# Patient Record
Sex: Female | Born: 1975 | Race: White | Hispanic: No | Marital: Married | Smoking: Never smoker
Health system: Southern US, Community
[De-identification: ages and names within clinical notes are randomized; demographics above are authoritative.]

## PROBLEM LIST (undated history)

## (undated) DIAGNOSIS — J189 Pneumonia, unspecified organism: Secondary | ICD-10-CM

## (undated) DIAGNOSIS — Z789 Other specified health status: Secondary | ICD-10-CM

---

## 2003-12-18 ENCOUNTER — Other Ambulatory Visit: Admission: RE | Admit: 2003-12-18 | Discharge: 2003-12-18 | Payer: Self-pay | Admitting: Obstetrics and Gynecology

## 2004-05-07 ENCOUNTER — Other Ambulatory Visit: Admission: RE | Admit: 2004-05-07 | Discharge: 2004-05-07 | Payer: Self-pay | Admitting: Obstetrics and Gynecology

## 2004-10-15 ENCOUNTER — Inpatient Hospital Stay (HOSPITAL_COMMUNITY): Admission: AD | Admit: 2004-10-15 | Discharge: 2004-10-18 | Payer: Self-pay | Admitting: Obstetrics and Gynecology

## 2004-10-19 ENCOUNTER — Encounter: Admission: RE | Admit: 2004-10-19 | Discharge: 2004-11-18 | Payer: Self-pay | Admitting: Pediatrics

## 2004-11-18 ENCOUNTER — Other Ambulatory Visit: Admission: RE | Admit: 2004-11-18 | Discharge: 2004-11-18 | Payer: Self-pay | Admitting: Obstetrics and Gynecology

## 2007-10-20 ENCOUNTER — Inpatient Hospital Stay (HOSPITAL_COMMUNITY): Admission: AD | Admit: 2007-10-20 | Discharge: 2007-10-23 | Payer: Self-pay | Admitting: Obstetrics and Gynecology

## 2007-10-20 ENCOUNTER — Encounter (INDEPENDENT_AMBULATORY_CARE_PROVIDER_SITE_OTHER): Payer: Self-pay | Admitting: Obstetrics & Gynecology

## 2010-07-07 NOTE — Op Note (Signed)
[name], [name]              ACCOUNT NO.:  [id number]   MEDICAL RECORD NO.:  [id number]          PATIENT TYPE:  INP   LOCATION:  9120                          FACILITY:  [facility]   PHYSICIAN:  Freddy Finner, M.D.   DATE OF BIRTH:  [birth year]   DATE OF PROCEDURE:  10/20/2007  DATE OF DISCHARGE:                               OPERATIVE REPORT   PREOPERATIVE DIAGNOSES:  1. Intrauterine pregnancy 36-1/[redacted] weeks gestation.  2. Previous cesarean delivery for failure to progress with a 5 pound      12 ounce infant.  3. Planned repeat cesarean section.   POSTOPERATIVE DIAGNOSES:  1. Intrauterine pregnancy 36-1/[redacted] weeks gestation.  2. Previous cesarean delivery for failure to progress with a 5 pound      12 ounce infant.  3. Planned repeat cesarean section.   OPERATIVE PROCEDURE:  Repeat low transverse cervical cesarean section  with midline extension and uterine incision with delivery of viable female  infant, Apgars of 8 and 9, arterial cord pH 7.28.  Delivery was vacuum-  assisted using a Kiwi device.   ANESTHESIA:  Spinal.   ESTIMATED BLOOD LOSS:  800 mL.   INTRAOPERATIVE COMPLICATIONS:  None.   DESCRIPTION OF PROCEDURE:  The patient is a 35 year old married, gravida  2, para 1, who has a history of a cesarean delivery after failure to  progress in labor with a small infant.  She was scheduled for repeat  cesarean but presented in labor on the morning of surgery.  Initial  attempts were made by Dr. Vincente Poli to suppress the labor with IM  terbutaline in hopes of allowing the husband to return from his work  with anticipated arrival at approximately 5:00 p.m.  After two  injections of terbutaline and IV fluid hydration.  The patient was  continuing to labor and cervix had progressed from 3 to 4 to 5 cm.  It  was elected at this point to proceed with cesarean delivery.   She was brought to the operating room and there placed under adequate IV  sedation. She was given a 1 gram bolus of  Ancef IV before the incision.  The abdomen was prepped and draped in the usual fashion with sterile  technique and Foley catheter was placed with sterile technique.  A lower  abdominal transverse incision was made through an old scar and carried  sharply down to the fascia.  Fascia was entered sharply and extended to  the external skin incision.  Rectus muscles were divided in midline.  Peritoneum was entered very high with marked advancement of the bladder  noted.  The bladder was carefully dissected off the lower segment.  Bladder blade was placed.  The uterine lower segment was incised sharply  with a scalpel in the transverse direction.  The amniotic fluid was  noted to be clear.  The incision was extended with bandage scissors.  There was marked scarring and variable elasticity of the abdominal wall  opening and in the uterus.  The infant was elevated out of the pelvis.  Device applied.  Initial attempt was unsuccessful to deliver and at  this  point the right rectus muscle was partially divided on the uterine  incision T'd with approximately 3 cm incision in the midline from the  lower incision.  This allowed delivery of the infant.  Single loose  nuchal cord or shoulder cord was reduced.  The infant was then delivered  without further difficulty and was a viable female.  Apgars were 8 and 9.  Cord pH 7.28.  This was an arterial sample.  The placenta was donated  for cord blood harvest.  The uterus was evacuated manually and careful  manual exploration confirmed complete evacuation of products of  conception.  Uterus was delivered onto the anterior abdominal wall.  Tubes and ovaries inspected and found to be normal.  The uterus itself  was normal.  Uterine incision was then closed with a running locking  Monocryl for the first layer, which included the vertical segment which  was approximated in the midline and at the midline of the lower margin  of the incision in the uterus.  An  imbricating suture of 0 Monocryl was  used for the second layer closure.  Two figure-of-eights were required  at the T portion of the incision for complete hemostasis.  The uterus  was delivered back into the abdominal cavity.  Irrigation was carried  out.  Hemostasis was adequate.  Pack, needle and instrument counts were  correct.  Abdominal incision was then closed in layers.  The peritoneum  was closed with a running 0 Monocryl suture which was also used to  approximate the lower portion of the rectus muscles.  An interrupted  figure-of-eight suture of 0 Monocryl was used to reapproximate the  divided right rectus muscle.  This included fascia in the upper margin  to hold the muscle in place.  The fascia was then closed with a looped 0  PDS running from angle to angle on either side.  Subcutaneous tissue was  approximated with 2-0 plain.  Bovie was used to control a couple  bleeding vessels and the subcutaneous space.  The hemostasis was felt to  be adequate.  The skin was closed with skin staples and quarter-inch  Steri-Strips.  Sterile dressing was applied.  The patient was taken to  the recovery room in good condition.      Freddy Finner, M.D.  Electronically Signed     WRN/MEDQ  D:  10/20/2007  T:  10/21/2007  Job:  [id number]

## 2010-07-10 NOTE — Discharge Summary (Signed)
[name], [name]              ACCOUNT NO.:  [id number]   MEDICAL RECORD NO.:  [id number]           PATIENT TYPE:   LOCATION:                                 FACILITY:   PHYSICIAN:  Duke Salvia. Marcelle Overlie, M.D.DATE OF BIRTH:  April 13, 1975   DATE OF ADMISSION:  10/19/2007  DATE OF DISCHARGE:  10/23/2007                               DISCHARGE SUMMARY   ADMITTING DIAGNOSIS:  1. Intrauterine pregnancy at 36-1/2 weeks estimated gestational age.  2. Previous cesarean section with plans of repeat scheduled.  3. Spontaneous onset of labor.   DISCHARGE DIAGNOSIS:  Status post low transverse cesarean section to a  viable female infant.   PROCEDURE:  Repeat low transverse cesarean section.   REASON FOR ADMISSION:  Please see written H&P.   HOSPITAL COURSE:  The patient is 35 year old gravida 2, para 1 who  presented to [facility] with early onset of labor.  The  patient had had a previous cesarean section and had a repeat scheduled.  On admission, the patient was given terbutaline in hopes that it would  allow time for husband to arrive at the hospital and to delay the onset  of her labor.  After 2 injections of the terbutaline and IV hydration,  the patient continued to labor and cervix had now progressed to  approximately 5 cm in dilatation.  Decision was made to proceed then  with the cesarean delivery.  She was then taken to the operating room  where spinal anesthesia was administered without difficulty.  A low  transverse incision was made with delivery of a viable female infant  weighing 6 pounds 14 ounces.  Apgars of 8 at 1 minute, and 9 at 5  minute.  Arterial cord pH of 7.28.  The patient tolerated the procedure  well and was taken to the recovery room in stable condition.  On  postoperative day #1, the patient was without complaint.  Vital signs  were stable.  She is afebrile.  Fundus firm and nontender.  Incision was  clean, dry, and intact.  Laboratory findings  showed hemoglobin of 10.6.  On postoperative day #2, the patient was without complaint.  Vital signs  were stable.  She is afebrile.  She is ambulating well, tolerating a  regular diet without complaints of nausea and vomiting.  On  postoperative day #3, the patient was without complaint.  Vital signs  remained stable.  She is afebrile.  Fundus firm and nontender.  Incision  was clean, dry, and intact.  Her staples removed and the patient was  later discharged home.   CONDITION ON DISCHARGE:  Stable.   DIET:  Regular as tolerated.   ACTIVITY:  No heavy lifting.  No driving x2 weeks.  No vaginal entry.   FOLLOW UP:  The patient to follow up in the office in 1-2 weeks for an  incision check.  She is to call for temperature greater than 100  degrees, persistent nausea, vomiting, heavy vaginal bleeding and/or  redness or drainage from the incisional site.   DISCHARGE MEDICATIONS:  1. Tylox #31 p.o. q. 6h.  2. Motrin 600 mg every 6 hours.  3. Prenatal vitamins 1 p.o. daily.  4. Colace 1 p.o. daily.      Julio Sicks, N.P.      Richard M. Marcelle Overlie, M.D.  Electronically Signed    CC/MEDQ  D:  11/22/2007  T:  11/22/2007  Job:  [id number]

## 2010-07-10 NOTE — Discharge Summary (Signed)
Anita, [name]              ACCOUNT NO.:  [id number]   MEDICAL RECORD NO.:  [id number]          PATIENT TYPE:  INP   LOCATION:  9103                          FACILITY:  [facility]   PHYSICIAN:  [name], M.D.   DATE OF BIRTH:  05-05-1975   DATE OF ADMISSION:  10/15/2004  DATE OF DISCHARGE:  10/18/2004                                 DISCHARGE SUMMARY   ADMITTING DIAGNOSES:  1.  Intrauterine pregnancy at 58 weeks estimated gestational age.  2.  Spontaneous onset of labor with spontaneous rupture of membranes.   DISCHARGE DIAGNOSES:  1.  Status post low transverse cesarean section secondary to failure to      progress with cephalopelvic disproportion.  2.  Viable female infant.   PROCEDURE:  Primary low transverse cesarean section.   REASON FOR ADMISSION:  Please see written H&P.   HOSPITAL COURSE:  The patient was a 35 year old primigravida that was  admitted to [facility] at 36 weeks estimated gestational  age with spontaneous onset of labor and spontaneous rupture of membranes.  Fluid was noted to be clear. Fetal heart tones were 142 beats per minute  with good reactivity. Cervix was dilated to 3 cm, 50% effaced vertex  presentation. Vital signs were otherwise stable. The patient was known to  have positive group B beta strep and IV antibiotics were administered  prophylactically. The patient did progress to full dilatation and began  pushing. The patient pushed for approximately 2 hours without further  descent other than a 0 to a +1 station. Fetal vertex was noted to be  molding. Fetal heart tones continued to be reactive. Decision was made to  proceed with a primary low transverse cesarean section. The patient was then  transferred to the operating room where spinal anesthesia was administered  without difficulty. A low transverse incision was made with the delivery of  a viable female infant weighing 5 pounds 5 ounces with Apgars of 9 at one  minute  and 9 at five minutes. Arterial cord pH was 7.35. The patient  tolerated the procedure well and was taken to the recovery room in stable  condition. On postoperative day #1 the patient was without complaint. Vital  signs were stable. She was afebrile at the time of rounding. Temperature max  had been noted to be 100.4 a 1 a.m. Abdomen was soft with good return of  bowel function. Fundus was firm and nontender. Abdominal dressing was noted  to be clean, dry and intact. Foley continued to drain and output had noted  to be 250 mL over the last 8 hours. Laboratory findings revealed hemoglobin  of 10.5; platelet count 157,000; wbc count of 18.5. The IV fluids were  discontinued; however, oral fluids were encouraged. Diet was advanced to a  regular diet and abdominal dressing was removed. On postoperative day #2 the  patient was without complaint. Vital signs were stable. She was now  afebrile. Fundus was firm and nontender. Incision was clean, dry and intact.  The patient was voiding well with sufficient amount of output. On  postoperative day #3 the  patient was without complaint. Vital signs remained  stable. Fundus firm and nontender. Incision was clean, dry and intact.  Staples were removed and the patient was discharged home.   CONDITION ON DISCHARGE:  Good.   DIET:  Regular as tolerated.   ACTIVITY:  No heavy lifting, no driving x2 weeks, no vaginal entry.   FOLLOW-UP:  The patient is to follow up in the office in 1 week for an  incision check. She is to call for temperature greater than 100 degrees,  persistent nausea and vomiting, heavy vaginal bleeding, and/or redness or  drainage from incisional site.   DISCHARGE MEDICATIONS:  1.  Tylox #30 one p.o. q.4-6h. p.r.n.  2.  Motrin 600 mg q.6h.  3.  Prenatal vitamins one p.o. daily.  4.  Colace one p.o. daily p.r.n.      Julio Sicks, N.P.      [name], M.D.  Electronically Signed    CC/MEDQ  D:  11/20/2004  T:   11/20/2004  Job:  [id number]

## 2010-07-10 NOTE — Op Note (Signed)
[name], [name]              ACCOUNT NO.:  [id number]   MEDICAL RECORD NO.:  [id number]          PATIENT TYPE:  INP   LOCATION:  9103                          FACILITY:  [facility]   PHYSICIAN:  Duke Salvia. Marcelle Overlie, M.D.DATE OF BIRTH:  Apr 22, 1975   DATE OF PROCEDURE:  10/15/2004  DATE OF DISCHARGE:                                 OPERATIVE REPORT   PREOPERATIVE DIAGNOSIS:  Cephalopelvic disproportion.   POSTOPERATIVE DIAGNOSIS:  Cephalopelvic disproportion.   OPERATION/PROCEDURE:  Primary low transverse cesarean section.   SURGEON:  Duke Salvia. Marcelle Overlie, M.D.   ANESTHESIA:  Epidural.   COMPLICATIONS:  None.   DRAINS:  Foley catheter.   ESTIMATED BLOOD LOSS:  1000 mL.   DESCRIPTION OF PROCEDURE:  The patient was taken to the operating room.  After an adequate level of epidural anesthetic was obtained with the  patient's legs supine, in the left tilt position, the abdomen was prepped  and draped in the usual manner for sterile abdominal procedure.  Foley  catheter was positioned draining clear urine.   A transverse incision was made two fingerbreadths below the symphysis and  carried down to the fascia which was incised and extended transversely.  Rectus muscle divided in the midline.  Peritoneum entered superiorly without  incident and extended in a vertical manner.  The bladder blade was inserted.  The vesicouterine serosa was then incised and the bladder was bluntly and  sharply dissected off the lower uterine segment and the bladder blade was  repositioned.  The uterine incision was then made and extended transversely  with blunt dissection.  Clear fluid was then noted.  The vertex was tight in  the pelvis but was easily dislodged, slightly rotated and delivered without  difficulty.  The  infant was suctioned, cord was clamped and the infant  passed to the pediatric team for further care.  A female weighed 5 pounds 5  ounces.  Apgars 9 and 9.  Cord PH was sent and is pending  at the time of  this dictation.  Placenta was delivered manually intact.  The uterus was  exteriorized.  Cavity wiped clean with laparotomy pack.  On the left angle  of the incision there was some extension downward into the edge of the  myometrium.  With careful exposure this was sutured at the angle with a  running locked 0 chromic suture followed by an imbricating layer of chromic.  This was hemostatic.  Repeat exposure revealed the bladder flap area to be  intact and hemostatic;  the uterine incision to be hemostatic also.  Prior  to closure, sponge, needle and instrument counts were reported as correct  x2.  The uterine incision was imbricated with an 0 chromic suture.  After  this again, hemostasis was noted to be excellent.  Tubes and ovaries were  normal.  Prior to closure, sponge, needle and instrument counts were  reported as correct x2.  Peritoneum closed with running 2-0 Vicryl suture.  Fascia was closed from laterally to midline at this time with a 0 PDS  suture.  clips and Steri-Strips used on the skin.  She tolerated this well  and went to the recovery room in good condition.   She had been on penicillin for group B strep prophylaxis and also received  Ancef 1 g IV after the cord was clamped.  Clear urine noted at the end of  the case.      Richard M. Marcelle Overlie, M.D.  Electronically Signed     RMH/MEDQ  D:  10/15/2004  T:  10/16/2004  Job:  [id number]

## 2010-11-01 ENCOUNTER — Inpatient Hospital Stay (HOSPITAL_COMMUNITY): Payer: BC Managed Care – PPO | Admitting: Anesthesiology

## 2010-11-01 ENCOUNTER — Encounter (HOSPITAL_COMMUNITY): Payer: Self-pay | Admitting: Anesthesiology

## 2010-11-01 ENCOUNTER — Other Ambulatory Visit: Payer: Self-pay | Admitting: Obstetrics and Gynecology

## 2010-11-01 ENCOUNTER — Inpatient Hospital Stay (HOSPITAL_COMMUNITY)
Admission: AD | Admit: 2010-11-01 | Discharge: 2010-11-04 | DRG: 371 | Disposition: A | Discharge status: Home / Self Care | Payer: BC Managed Care – PPO | Source: Ambulatory Visit | Attending: Obstetrics and Gynecology | Admitting: Obstetrics and Gynecology

## 2010-11-01 ENCOUNTER — Encounter (HOSPITAL_COMMUNITY)
Admission: AD | Disposition: A | Discharge status: Home / Self Care | Payer: Self-pay | Source: Ambulatory Visit | Attending: Obstetrics and Gynecology

## 2010-11-01 ENCOUNTER — Encounter (HOSPITAL_COMMUNITY): Payer: Self-pay | Admitting: *Deleted

## 2010-11-01 DIAGNOSIS — Z98891 History of uterine scar from previous surgery: Secondary | ICD-10-CM

## 2010-11-01 DIAGNOSIS — O34219 Maternal care for unspecified type scar from previous cesarean delivery: Principal | ICD-10-CM | POA: Diagnosis present

## 2010-11-01 HISTORY — DX: Other specified health status: Z78.9

## 2010-11-01 LAB — URINALYSIS, ROUTINE W REFLEX MICROSCOPIC
Bilirubin Urine: NEGATIVE
Leukocytes, UA: NEGATIVE
Nitrite: NEGATIVE
Specific Gravity, Urine: 1.025 (ref 1.005–1.030)
Urobilinogen, UA: 0.2 mg/dL (ref 0.0–1.0)
pH: 6.5 (ref 5.0–8.0)

## 2010-11-01 LAB — CBC
MCH: 34.4 pg — ABNORMAL HIGH (ref 26.0–34.0)
MCHC: 34.4 g/dL (ref 30.0–36.0)
Platelets: 105 10*3/uL — ABNORMAL LOW (ref 150–400)
RDW: 13.7 % (ref 11.5–15.5)

## 2010-11-01 SURGERY — Surgical Case
Anesthesia: Regional

## 2010-11-01 MED ORDER — FENTANYL CITRATE 0.05 MG/ML IJ SOLN: 25.0000 ug | INTRAMUSCULAR | Status: DC | PRN

## 2010-11-01 MED ORDER — MEPERIDINE HCL 25 MG/ML IJ SOLN: 6.2500 mg | INTRAMUSCULAR | Status: DC | PRN

## 2010-11-01 MED ORDER — MORPHINE SULFATE 0.5 MG/ML IJ SOLN
INTRAMUSCULAR | Status: AC
Start: 2010-11-01 — End: 2010-11-01
  Filled 2010-11-01: qty 10

## 2010-11-01 MED ORDER — PRENATAL PLUS 27-1 MG PO TABS
1.0000 | ORAL_TABLET | Freq: Every day | ORAL | Status: DC
  Administered 2010-11-02 – 2010-11-04 (×3): 1 via ORAL
  Filled 2010-11-01 (×3): qty 1

## 2010-11-01 MED ORDER — PROMETHAZINE HCL 25 MG/ML IJ SOLN: 6.2500 mg | INTRAMUSCULAR | Status: DC | PRN

## 2010-11-01 MED ORDER — DEXTROSE 5 % IV SOLN: 2.0000 g | INTRAVENOUS | Status: DC

## 2010-11-01 MED ORDER — PRENATAL PLUS 27-1 MG PO TABS: 1.0000 | ORAL_TABLET | Freq: Every day | ORAL | Status: DC

## 2010-11-01 MED ORDER — OXYCODONE-ACETAMINOPHEN 5-325 MG PO TABS
1.0000 | ORAL_TABLET | ORAL | Status: DC | PRN
  Administered 2010-11-02 (×2): 1 via ORAL
  Filled 2010-11-01: qty 1
  Filled 2010-11-01 (×2): qty 2
  Filled 2010-11-01: qty 1
  Filled 2010-11-01 (×6): qty 2

## 2010-11-01 MED ORDER — MEDROXYPROGESTERONE ACETATE 150 MG/ML IM SUSP: 150.0000 mg | INTRAMUSCULAR | Status: DC | PRN

## 2010-11-01 MED ORDER — ONDANSETRON HCL 4 MG PO TABS: 4.0000 mg | ORAL_TABLET | ORAL | Status: DC | PRN

## 2010-11-01 MED ORDER — FENTANYL CITRATE 0.05 MG/ML IJ SOLN
INTRAMUSCULAR | Status: DC | PRN
  Administered 2010-11-01: 25 ug via INTRAVENOUS

## 2010-11-01 MED ORDER — DIPHENHYDRAMINE HCL 50 MG/ML IJ SOLN: 12.5000 mg | INTRAMUSCULAR | Status: DC | PRN

## 2010-11-01 MED ORDER — KETOROLAC TROMETHAMINE 30 MG/ML IJ SOLN: 30.0000 mg | Freq: Four times a day (QID) | INTRAMUSCULAR | Status: AC | PRN

## 2010-11-01 MED ORDER — ONDANSETRON HCL 4 MG/2ML IJ SOLN
INTRAMUSCULAR | Status: DC | PRN
  Administered 2010-11-01: 4 mg via INTRAVENOUS

## 2010-11-01 MED ORDER — IBUPROFEN 600 MG PO TABS
600.0000 mg | ORAL_TABLET | Freq: Four times a day (QID) | ORAL | Status: DC | PRN
  Administered 2010-11-01: 600 mg via ORAL
  Filled 2010-11-01: qty 1

## 2010-11-01 MED ORDER — OXYTOCIN 10 UNIT/ML IJ SOLN
INTRAMUSCULAR | Status: DC | PRN
  Administered 2010-11-01 (×2): 20 [IU] via INTRAMUSCULAR

## 2010-11-01 MED ORDER — DEXTROSE IN LACTATED RINGERS 5 % IV SOLN: INTRAVENOUS | Status: DC

## 2010-11-01 MED ORDER — OXYTOCIN 20 UNITS IN LACTATED RINGERS INFUSION - SIMPLE: 125.0000 mL/h | INTRAVENOUS | Status: AC

## 2010-11-01 MED ORDER — CITRIC ACID-SODIUM CITRATE 334-500 MG/5ML PO SOLN
ORAL | Status: AC
  Administered 2010-11-01: 30 mL
  Filled 2010-11-01: qty 15

## 2010-11-01 MED ORDER — SIMETHICONE 80 MG PO CHEW: 80.0000 mg | CHEWABLE_TABLET | ORAL | Status: DC | PRN

## 2010-11-01 MED ORDER — EPHEDRINE 5 MG/ML INJ
INTRAVENOUS | Status: AC
  Filled 2010-11-01: qty 10

## 2010-11-01 MED ORDER — ONDANSETRON HCL 4 MG/2ML IJ SOLN: 4.0000 mg | INTRAMUSCULAR | Status: DC | PRN

## 2010-11-01 MED ORDER — MEASLES, MUMPS & RUBELLA VAC ~~LOC~~ INJ
0.5000 mL | INJECTION | Freq: Once | SUBCUTANEOUS | Status: DC
  Filled 2010-11-01: qty 0.5

## 2010-11-01 MED ORDER — DIPHENHYDRAMINE HCL 25 MG PO CAPS: 25.0000 mg | ORAL_CAPSULE | Freq: Four times a day (QID) | ORAL | Status: DC | PRN

## 2010-11-01 MED ORDER — NALBUPHINE SYRINGE 5 MG/0.5 ML
5.0000 mg | INJECTION | INTRAMUSCULAR | Status: DC | PRN
  Filled 2010-11-01: qty 1

## 2010-11-01 MED ORDER — SENNOSIDES-DOCUSATE SODIUM 8.6-50 MG PO TABS
2.0000 | ORAL_TABLET | Freq: Every day | ORAL | Status: DC
  Administered 2010-11-01 – 2010-11-03 (×2): 2 via ORAL

## 2010-11-01 MED ORDER — NALBUPHINE HCL 10 MG/ML IJ SOLN
5.0000 mg | INTRAMUSCULAR | Status: DC | PRN
  Filled 2010-11-01: qty 1

## 2010-11-01 MED ORDER — METOCLOPRAMIDE HCL 5 MG/ML IJ SOLN: 10.0000 mg | Freq: Three times a day (TID) | INTRAMUSCULAR | Status: DC | PRN

## 2010-11-01 MED ORDER — DEXTROSE 5 % IV SOLN: 1.0000 g | INTRAVENOUS | Status: DC

## 2010-11-01 MED ORDER — NALOXONE HCL 0.4 MG/ML IJ SOLN: 0.4000 mg | INTRAMUSCULAR | Status: DC | PRN

## 2010-11-01 MED ORDER — EPHEDRINE SULFATE 50 MG/ML IJ SOLN
INTRAMUSCULAR | Status: DC | PRN
  Administered 2010-11-01 (×3): 10 mg via INTRAVENOUS

## 2010-11-01 MED ORDER — SODIUM CHLORIDE 0.9 % IJ SOLN: 3.0000 mL | INTRAMUSCULAR | Status: DC | PRN

## 2010-11-01 MED ORDER — DIPHENHYDRAMINE HCL 25 MG PO CAPS: 25.0000 mg | ORAL_CAPSULE | ORAL | Status: DC | PRN

## 2010-11-01 MED ORDER — LANOLIN HYDROUS EX OINT: 1.0000 "application " | TOPICAL_OINTMENT | CUTANEOUS | Status: DC | PRN

## 2010-11-01 MED ORDER — DIBUCAINE 1 % RE OINT: 1.0000 "application " | TOPICAL_OINTMENT | RECTAL | Status: DC | PRN

## 2010-11-01 MED ORDER — SIMETHICONE 80 MG PO CHEW
80.0000 mg | CHEWABLE_TABLET | Freq: Three times a day (TID) | ORAL | Status: DC
  Administered 2010-11-01 – 2010-11-04 (×9): 80 mg via ORAL

## 2010-11-01 MED ORDER — SODIUM CHLORIDE 0.9 % IV SOLN
1.0000 ug/kg/h | INTRAVENOUS | Status: DC | PRN
  Filled 2010-11-01: qty 2.5

## 2010-11-01 MED ORDER — WITCH HAZEL-GLYCERIN EX PADS: 1.0000 "application " | MEDICATED_PAD | CUTANEOUS | Status: DC | PRN

## 2010-11-01 MED ORDER — SCOPOLAMINE 1 MG/3DAYS TD PT72
MEDICATED_PATCH | TRANSDERMAL | Status: AC
  Administered 2010-11-01: 1.5 mg
  Filled 2010-11-01: qty 1

## 2010-11-01 MED ORDER — ACETAMINOPHEN 10 MG/ML IV SOLN
1000.0000 mg | Freq: Four times a day (QID) | INTRAVENOUS | Status: AC | PRN
  Filled 2010-11-01: qty 100

## 2010-11-01 MED ORDER — FAMOTIDINE IN NACL 20-0.9 MG/50ML-% IV SOLN
INTRAVENOUS | Status: AC
  Administered 2010-11-01: 10:00:00
  Filled 2010-11-01: qty 50

## 2010-11-01 MED ORDER — OXYTOCIN 10 UNIT/ML IJ SOLN
INTRAMUSCULAR | Status: AC
  Filled 2010-11-01: qty 2

## 2010-11-01 MED ORDER — ONDANSETRON HCL 4 MG/2ML IJ SOLN
INTRAMUSCULAR | Status: AC
  Filled 2010-11-01: qty 2

## 2010-11-01 MED ORDER — ONDANSETRON HCL 4 MG/2ML IJ SOLN: 4.0000 mg | Freq: Three times a day (TID) | INTRAMUSCULAR | Status: DC | PRN

## 2010-11-01 MED ORDER — SCOPOLAMINE 1 MG/3DAYS TD PT72
1.0000 | MEDICATED_PATCH | Freq: Once | TRANSDERMAL | Status: DC
  Filled 2010-11-01: qty 1

## 2010-11-01 MED ORDER — TERBUTALINE SULFATE 1 MG/ML IJ SOLN
0.2500 mg | Freq: Once | INTRAMUSCULAR | Status: AC
  Administered 2010-11-01: 0.25 mg via SUBCUTANEOUS
  Filled 2010-11-01: qty 1

## 2010-11-01 MED ORDER — CEFAZOLIN SODIUM 1-5 GM-% IV SOLN
INTRAVENOUS | Status: DC | PRN
  Administered 2010-11-01: 2 g via INTRAVENOUS

## 2010-11-01 MED ORDER — FENTANYL CITRATE 0.05 MG/ML IJ SOLN
INTRAMUSCULAR | Status: AC
  Filled 2010-11-01: qty 2

## 2010-11-01 MED ORDER — TETANUS-DIPHTH-ACELL PERTUSSIS 5-2.5-18.5 LF-MCG/0.5 IM SUSP: 0.5000 mL | Freq: Once | INTRAMUSCULAR | Status: DC

## 2010-11-01 MED ORDER — DIPHENHYDRAMINE HCL 50 MG/ML IJ SOLN: 25.0000 mg | INTRAMUSCULAR | Status: DC | PRN

## 2010-11-01 MED ORDER — BUPIVACAINE IN DEXTROSE 0.75-8.25 % IT SOLN
INTRATHECAL | Status: DC | PRN
  Administered 2010-11-01: 1.5 mL via INTRATHECAL

## 2010-11-01 MED ORDER — MORPHINE SULFATE (PF) 0.5 MG/ML IJ SOLN
INTRAMUSCULAR | Status: DC | PRN
  Administered 2010-11-01: .1 mg via EPIDURAL

## 2010-11-01 MED ORDER — MENTHOL 3 MG MT LOZG: 1.0000 | LOZENGE | OROMUCOSAL | Status: DC | PRN

## 2010-11-01 MED ORDER — ACETAMINOPHEN 325 MG PO TABS
325.0000 mg | ORAL_TABLET | ORAL | Status: DC | PRN
  Administered 2010-11-02 – 2010-11-04 (×6): 650 mg via ORAL
  Filled 2010-11-01 (×6): qty 2

## 2010-11-01 MED ORDER — LACTATED RINGERS IV SOLN
INTRAVENOUS | Status: DC
  Administered 2010-11-01 (×3): via INTRAVENOUS

## 2010-11-01 SURGICAL SUPPLY — 26 items
CHLORAPREP W/TINT 26ML (MISCELLANEOUS) ×2 IMPLANT
CLOTH BEACON ORANGE TIMEOUT ST (SAFETY) ×2 IMPLANT
ELECT REM PT RETURN 9FT ADLT (ELECTROSURGICAL) ×2
ELECTRODE REM PT RTRN 9FT ADLT (ELECTROSURGICAL) ×1 IMPLANT
EXTRACTOR VACUUM M CUP 4 TUBE (SUCTIONS) ×1 IMPLANT
GLOVE BIO SURGEON STRL SZ 6.5 (GLOVE) ×2 IMPLANT
GLOVE BIOGEL PI IND STRL 7.0 (GLOVE) ×2 IMPLANT
GLOVE BIOGEL PI INDICATOR 7.0 (GLOVE) ×2
GOWN PREVENTION PLUS LG XLONG (DISPOSABLE) ×6 IMPLANT
KIT ABG SYR 3ML LUER SLIP (SYRINGE) ×2 IMPLANT
NDL HYPO 25X5/8 SAFETYGLIDE (NEEDLE) ×1 IMPLANT
NEEDLE HYPO 25X5/8 SAFETYGLIDE (NEEDLE) ×2 IMPLANT
NS IRRIG 1000ML POUR BTL (IV SOLUTION) ×2 IMPLANT
PACK C SECTION WH (CUSTOM PROCEDURE TRAY) ×2 IMPLANT
PAD ABD DERMACEA PRESS 5X9 (GAUZE/BANDAGES/DRESSINGS) ×1 IMPLANT
SLEEVE SCD COMPRESS KNEE MED (MISCELLANEOUS) ×1 IMPLANT
STAPLER VISISTAT 35W (STAPLE) ×1 IMPLANT
SUT CHROMIC 0 CT 802H (SUTURE) IMPLANT
SUT CHROMIC 0 CTX 36 (SUTURE) ×6 IMPLANT
SUT MNCRL AB 3-0 PS2 27 (SUTURE) IMPLANT
SUT MON AB-0 CT1 36 (SUTURE) ×3 IMPLANT
SUT PDS AB 0 CTX 60 (SUTURE) ×2 IMPLANT
SUT PLAIN 0 NONE (SUTURE) IMPLANT
TOWEL OR 17X24 6PK STRL BLUE (TOWEL DISPOSABLE) ×4 IMPLANT
TRAY FOLEY CATH 14FR (SET/KITS/TRAYS/PACK) ×1 IMPLANT
WATER STERILE IRR 1000ML POUR (IV SOLUTION) ×2 IMPLANT

## 2010-11-01 NOTE — ED Provider Notes (Signed)
[name] is 35 y.o. [id number]. [redacted]w[redacted]d She presents complaining of Contractions  .  Onset is described as gradual and has been present since 2100 last night. States ctxs getting stronger and closer together. Reports + FM. Denies blding or LOF  Pt state she is scheduled for a repeat c-section on 11/25/10  Obstetrical/Gynecological History:  OB History    Grav  Para  Term  Preterm  Abortions  TAB  SAB  Ect  Mult  Living    3  2  0  2  0  0  0  0  0  2      Past Medical History:  Past Medical History   Diagnosis  Date   .  No pertinent past medical history     Past Surgical History:  Past Surgical History   Procedure  Date   .  Cesarean section     Family History:  No family history on file.  Social History:  History   Substance Use Topics   .  Smoking status:  Never Smoker   .  Smokeless tobacco:  Not on file   .  Alcohol Use:  No    Allergies: No Known Allergies  Prescriptions prior to admission   Medication  Sig  Dispense  Refill   .  calcium carbonate (TUMS - DOSED IN MG ELEMENTAL CALCIUM) 500 MG chewable tablet  Chew 1 tablet by mouth daily as needed. For heartburn     .  prenatal vitamin w/FE, FA (PRENATAL 1 + 1) 27-1 MG TABS  Take 1 tablet by mouth daily.      Review of Systems - Negative except what has been reviewed in the HPI  Physical Exam   Blood pressure 125/78, pulse 77, temperature 98.9 F (37.2 C), temperature source Oral, resp. rate 20, height 5\' 2"  (1.575 m), weight 99.882 kg (220 lb 3.2 oz).  General: General appearance - alert, well appearing, and in no distress and overweight  Mental status - alert, oriented to person, place, and time, normal mood, behavior, speech, dress, motor activity, and thought processes  Abdomen - gravid, ctxing, mild/mod to palp  Focused Gynecological Exam: CERVIX: FT/70/vtx/ballotable  FHR: 130, mod variability, + 15x15 accels, no decels  Toco: 3-5   MAU course: MD Consult: Discussed with Dr. Renaldo Fiddler, orders received for .25mg   Terbutaline SQ. Plan to obs and recheck cervix in 2 hours   Care assumed from [place], CNM.   Recheck of cervix after >2 hours observation - 1.5/75/-1 Pt reports contractions are returning and becoming more uncomfortable, rates at 5/10.   Consult with Dr. Renaldo Fiddler, plan to proceed with repeat c/s.

## 2010-11-01 NOTE — Anesthesia Procedure Notes (Signed)
Spinal Block  Patient location during procedure: OR Start time: 11/01/2010 11:00 AM Staffing Performed by: anesthesiologist  Preanesthetic Checklist Completed: patient identified, site marked, surgical consent, pre-op evaluation, timeout performed, IV checked, risks and benefits discussed and monitors and equipment checked Spinal Block Patient position: sitting Prep: DuraPrep Patient monitoring: heart rate, cardiac monitor, continuous pulse ox and blood pressure Approach: midline Location: L3-4 Injection technique: single-shot Needle Needle type: Sprotte  Needle gauge: 24 G Needle length: 9 cm Assessment Sensory level: T4 Additional Notes Patient identified.  Risk benefits discussed including failed block, incomplete pain control, headache, nerve damage, paralysis, blood pressure changes, nausea, vomiting, reactions to medication both toxic or allergic, and postpartum back pain.  Patient expressed understanding and wished to proceed.  All questions were answered.  Sterile technique used throughout procedure.  CSF was clear.  No parasthesia or other complications.  Please see nursing notes for vital signs.

## 2010-11-01 NOTE — Anesthesia Postprocedure Evaluation (Signed)
Anesthesia Post Note  Patient: [name]  Procedure(s) Performed:  CESAREAN SECTION  Anesthesia type: Spinal  Patient location: PACU  Post pain: Pain level controlled  Post assessment: Post-op Vital signs reviewed  Last Vitals:  Filed Vitals:   11/01/10 1315  BP: 109/65  Pulse: 86  Temp: 98.4 F (36.9 C)  Resp: 16    Post vital signs: Reviewed  Level of consciousness: awake  Complications: No apparent anesthesia complications

## 2010-11-01 NOTE — Transfer of Care (Signed)
Immediate Anesthesia Transfer of Care Note  Patient: [name]  Procedure(s) Performed:  CESAREAN SECTION  Patient Location: PACU  Anesthesia Type: Spinal  Level of Consciousness: awake, alert  and oriented  Airway & Oxygen Therapy: Patient Spontanous Breathing  Post-op Assessment: Report given to PACU RN vital signs stable  Post vital signs: Reviewed and stable  Complications: No apparent anesthesia complications

## 2010-11-01 NOTE — Anesthesia Preprocedure Evaluation (Signed)
Anesthesia Evaluation  Name, MR# and DOB Patient awake  General Assessment Comment  Reviewed: Allergy & Precautions, H&P , Patient's Chart, lab work & pertinent test results  Airway Mallampati: III TM Distance: >3 FB Neck ROM: full    Dental No notable dental hx.    Pulmonary  clear to auscultation  pulmonary exam normalPulmonary Exam Normal breath sounds clear to auscultation none    Cardiovascular regular Normal    Neuro/Psych Negative Neurological ROS  Negative Psych ROS  GI/Hepatic/Renal negative GI ROS  negative Liver ROS  negative Renal ROS        Endo/Other  Negative Endocrine ROS (+)      Abdominal   Musculoskeletal   Hematology negative hematology ROS (+)   Peds  Reproductive/Obstetrics (+) Pregnancy    Anesthesia Other Findings             Anesthesia Physical Anesthesia Plan  ASA: III and Emergent  Anesthesia Plan: Spinal   Post-op Pain Management:    Induction:   Airway Management Planned:   Additional Equipment:   Intra-op Plan:   Post-operative Plan:   Informed Consent: I have reviewed the patients History and Physical, chart, labs and discussed the procedure including the risks, benefits and alternatives for the proposed anesthesia with the patient or authorized representative who has indicated his/her understanding and acceptance.     Plan Discussed with:   Anesthesia Plan Comments:         Anesthesia Quick Evaluation

## 2010-11-01 NOTE — Consult Note (Addendum)
Neonatology Note:  Attendance at C-section:  I was asked to attend this repeat C/S at 35 4/7 weeks due to onset of labor. The mother is a G3P2 GBS unknown with history of preterm delivery (at 36 weeks times 2). ROM at delivery, fluid clear. Vacuum-assisted delivery, vertex. Infant vigorous with good spontaneous cry and tone. Needed only minimal bulb suctioning. Ap 9/9. Lungs clear to ausc in DR. Bruising behind left knee noted in DR. Further bruising was noted on the left arm and torso after admission to the CN. No petechiae, only benign pustular melanosis with cuffing. Transferred to care of Pediatrician.  Karol Liendo, MD  

## 2010-11-01 NOTE — Progress Notes (Signed)
G3P2 at 35.5wks. Contractions since 2100 which have gotten closer and stronger. Denies bleeding or d/c. For scheduled repeat C/S 10/3

## 2010-11-01 NOTE — ED Provider Notes (Signed)
Chief Complaint:  Contractions   [name] is  35 y.o. [id number]. [redacted]w[redacted]d  She presents complaining of Contractions . Onset is described as gradual and has been present since 2100 last night. States ctxs getting stronger and closer together. Reports + FM. Denies blding or LOF Pt state she is scheduled for a repeat c-section on 11/25/10  Obstetrical/Gynecological History: OB History    Grav Para Term Preterm Abortions TAB SAB Ect Mult Living   3 2 0 2 0 0 0 0 0 2       Past Medical History: Past Medical History  Diagnosis Date  . No pertinent past medical history     Past Surgical History: Past Surgical History  Procedure Date  . Cesarean section     Family History: No family history on file.  Social History: History  Substance Use Topics  . Smoking status: Never Smoker   . Smokeless tobacco: Not on file  . Alcohol Use: No    Allergies: No Known Allergies  Prescriptions prior to admission  Medication Sig Dispense Refill  . calcium carbonate (TUMS - DOSED IN MG ELEMENTAL CALCIUM) 500 MG chewable tablet Chew 1 tablet by mouth daily as needed. For heartburn       . prenatal vitamin w/FE, FA (PRENATAL 1 + 1) 27-1 MG TABS Take 1 tablet by mouth daily.          Review of Systems - Negative except what has been reviewed in the HPI  Physical Exam   Blood pressure 125/78, pulse 77, temperature 98.9 F (37.2 C), temperature source Oral, resp. rate 20, height 5\' 2"  (1.575 m), weight 99.882 kg (220 lb 3.2 oz).  General: General appearance - alert, well appearing, and in no distress and overweight Mental status - alert, oriented to person, place, and time, normal mood, behavior, speech, dress, motor activity, and thought processes Abdomen - gravid, ctxing, mild/mod to palp Focused Gynecological Exam: CERVIX: FT/70/vtx/ballotable FHR: 130, mod variability, + 15x15 accels, no decels Toco: 3-5  MD Consult: Discussed with Dr. Renaldo Fiddler, orders received for .25mg  Terbutaline SQ.  Plan to obs and recheck cervix in 2 hours  Assessment: There is no problem list on file for this patient.   Plan: Care assumed by Henrietta Hoover, PAC  Jerrion Tabbert E. 11/01/2010,7:18 AM

## 2010-11-01 NOTE — H&P (Signed)
35 yo G3P0202 @ 35+4 wks presents in labor.  Ctx began last night and are now more intense.  No VB or lof.  +FM.  Pregnancy uncomplicated.  Past History - See [facility], previous c-section x 2  AF, VSS Gen - uncomfortable w/ ctx Abd - gravid, NT Ext - NT, mild edema bilat Cvx  1.5cm (changed from 0.5cm)  A/P:  R/B/A of repeat c-section d/w pt.  Questions answered and informed consent obtained.  Will prep for OR

## 2010-11-01 NOTE — Op Note (Signed)
Cesarean Section Procedure Note   [name]  11/01/2010  Indications: Preterm labor, prior c-section x 2   Pre-operative Diagnosis: Previous Cesarean, In Labor, 35.4 weeks.   Post-operative Diagnosis: Same   Surgeon: Surgeon(s) and Role:    Zelphia Cairo - Primary   Assistants: none  Anesthesia: spinal   Procedure Details:  The patient was seen in the Holding Room. The risks, benefits, complications, treatment options, and expected outcomes were discussed with the patient. The patient concurred with the proposed plan, giving informed consent. identified as [name] and the procedure verified as C-Section Delivery. A Time Out was held and the above information confirmed.  After induction of anesthesia, the patient was draped and prepped in the usual sterile manner. A transverse was made and carried down through the subcutaneous tissue to the fascia. Fascial incision was made and extended transversely. The fascia was separated from the underlying rectus tissue superiorly and inferiorly. The peritoneum was identified and entered. Peritoneal incision was extended longitudinally. The utero-vesical peritoneal reflection was incised transversely and the bladder flap was bluntly freed from the lower uterine segment. A low transverse uterine incision was made. Delivered from cephalic presentation was a 6#2ozLiving newborn infant(s) with Apgar scores of 9 at one minute and 9 at five minutes.  During delivery of the fetal head, the baby was rotating to transverse position with fundal pressure.  The vacuum was applied to keep the baby straight and the infant was delivered with gentle traction on the vacuum and fundal pressure. Cord ph was sent the umbilical cord was clamped and cut cord blood was obtained for evaluation. The placenta was removed Intact and appeared normal. The uterine outline, tubes and ovaries appeared normal}. The uterine incision was closed with running locked sutures of  0chromic gut.   Hemostasis was observed. Lavage was carried out until clear.  Peritoneum was reapproximated with 0 monocryl.  The fascia was then reapproximated with running sutures of 0PDS.  The skin was closed with staples. Instrument, sponge, and needle counts were correct prior the abdominal closure and were correct at the conclusion of the case.    Findings:  vigerous female w/ apgars 9/9.  Normal female anatomy.     Estimated Blood Loss: 700cc  Urine Output: clear  Specimens:  Specimens    None       Complications: no complications  Disposition: PACU - hemodynamically stable.   Maternal Condition: stable   Baby condition / location:  nursery-stable  Attending Attestation: I was present and scrubbed for the entire procedure.   Signed: Surgeon(s): Zelphia Cairo

## 2010-11-02 LAB — CBC
HCT: 28.2 % — ABNORMAL LOW (ref 36.0–46.0)
Hemoglobin: 9.6 g/dL — ABNORMAL LOW (ref 12.0–15.0)
MCH: 34.7 pg — ABNORMAL HIGH (ref 26.0–34.0)
MCHC: 34 g/dL (ref 30.0–36.0)
MCV: 101.8 fL — ABNORMAL HIGH (ref 78.0–100.0)

## 2010-11-02 NOTE — Progress Notes (Signed)
Patient's platelet count 83.  Scheduled Ibuprofen not ordered, however, PRN ibuprofen active in orders.  Patient is taking percocet and is comfortable at this time.  Called Dr. Marcelle Overlie to verify if patient could receive ibuprofen or not.  Dr. Marcelle Overlie ordered that ibuprofen could be discontinued and ordered for a repeat CBC in am.  Will continue to monitor patient.  Earl Gala, Linda Hedges [place]

## 2010-11-02 NOTE — Progress Notes (Signed)
Subjective: Postpartum Day 1: Cesarean Delivery Patient reports tolerating PO and + flatus.    Objective: Vital signs in last 24 hours: Temp:  [97.7 F (36.5 C)-98.9 F (37.2 C)] 98.4 F (36.9 C) (09/10 0600) Pulse Rate:  [68-89] 76  (09/10 0600) Resp:  [16-24] 24  (09/10 0600) BP: (89-131)/(41-77) 92/62 mmHg (09/10 0600) SpO2:  [97 %-100 %] 98 % (09/10 0600)  Physical Exam:  General: alert and cooperative Lochia: appropriate Uterine Fundus: firm Abd dressing CDI Foley with adequate op DVT Evaluation: No evidence of DVT seen on physical exam.   Basename 11/02/10 0505 11/01/10 1007  HGB 9.6* 11.6*  HCT 28.2* 33.7*    Assessment/Plan: Status post Cesarean section. Doing well postoperatively.  Continue current care.  Chosen Geske G 11/02/2010, 8:26 AM

## 2010-11-03 LAB — CBC
HCT: 28.2 % — ABNORMAL LOW (ref 36.0–46.0)
MCHC: 33 g/dL (ref 30.0–36.0)
MCV: 102.9 fL — ABNORMAL HIGH (ref 78.0–100.0)
Platelets: 91 10*3/uL — ABNORMAL LOW (ref 150–400)
RDW: 14.2 % (ref 11.5–15.5)
WBC: 8 10*3/uL (ref 4.0–10.5)

## 2010-11-03 NOTE — Progress Notes (Signed)
Subjective: Postpartum Day 2: Cesarean Delivery Patient reports tolerating PO, + flatus and no problems voiding.    Objective: Vital signs in last 24 hours: Temp:  [98.2 F (36.8 C)-98.7 F (37.1 C)] 98.4 F (36.9 C) (09/11 0610) Pulse Rate:  [73-85] 73  (09/11 0610) Resp:  [18-20] 18  (09/11 0610) BP: (93-123)/(66-70) 123/70 mmHg (09/11 0610) SpO2:  [98 %] 98 % (09/10 1040)  Physical Exam:  General: alert and cooperative Lochia: appropriate Uterine Fundus: firm Incision: healing well DVT Evaluation: No evidence of DVT seen on physical exam.   Basename 11/03/10 0555 11/02/10 0505  HGB 9.3* 9.6*  HCT 28.2* 28.2*    Assessment/Plan: Status post Cesarean section. Doing well postoperatively.  Continue current care.  Joniece Smotherman G 11/03/2010, 8:56 AM

## 2010-11-04 NOTE — Progress Notes (Signed)
Subjective: Postpartum Day 3: Cesarean Delivery Patient reports tolerating PO.    Objective: Vital signs in last 24 hours: Temp:  [98.1 F (36.7 C)-98.2 F (36.8 C)] 98.1 F (36.7 C) (09/12 0543) Pulse Rate:  [74-81] 74  (09/12 0543) Resp:  [18] 18  (09/12 0543) BP: (93-103)/(63-70) 103/70 mmHg (09/12 0543)  Physical Exam:  General: alert Lochia: appropriate Uterine Fundus: firm Incision: healing well DVT Evaluation: No evidence of DVT seen on physical exam.   Basename 11/03/10 0555 11/02/10 0505  HGB 9.3* 9.6*  HCT 28.2* 28.2*    Assessment/Plan: Status post Cesarean section. Doing well postoperatively.  Discharge home with standard precautions and return to clinic in 4-6 weeks.  Lorianna Spadaccini S 11/04/2010, 9:06 AM

## 2010-11-04 NOTE — Discharge Summary (Signed)
Obstetric Discharge Summary Reason for Admission: onset of labor Prenatal Procedures: none Intrapartum Procedures: cesarean: low cervical, transverse Postpartum Procedures: none Complications-Operative and Postpartum: none Hemoglobin  Date Value Range Status  11/03/2010 9.3* 12.0-15.0 (g/dL) Final     HCT  Date Value Range Status  11/03/2010 28.2* 36.0-46.0 (%) Final    Discharge Diagnoses: Term Pregnancy-delivered  Discharge Information: Date: 11/04/2010 Activity: pelvic rest Diet: routine Medications: PNV Condition: stable Instructions: refer to practice specific booklet Discharge to: home   Newborn Data: Live born female  Birth Weight: 6 lb 1.9 oz (2775 g) APGAR: 9, 9  Home with mother.  [name] S 11/04/2010, 9:07 AM

## 2010-11-19 ENCOUNTER — Encounter (HOSPITAL_COMMUNITY): Payer: Self-pay | Admitting: Obstetrics and Gynecology

## 2010-11-25 ENCOUNTER — Encounter (HOSPITAL_COMMUNITY): Admission: RE | Payer: Self-pay | Source: Ambulatory Visit

## 2010-11-25 ENCOUNTER — Inpatient Hospital Stay (HOSPITAL_COMMUNITY): Admission: RE | Admit: 2010-11-25 | Payer: Self-pay | Source: Ambulatory Visit | Admitting: Obstetrics and Gynecology

## 2010-11-25 SURGERY — Surgical Case
Anesthesia: Regional

## 2010-11-30 ENCOUNTER — Encounter (HOSPITAL_COMMUNITY): Payer: Self-pay | Admitting: *Deleted

## 2012-09-26 ENCOUNTER — Other Ambulatory Visit: Payer: Self-pay | Admitting: Obstetrics and Gynecology

## 2013-12-24 ENCOUNTER — Encounter (HOSPITAL_COMMUNITY): Payer: Self-pay | Admitting: *Deleted

## 2014-01-30 ENCOUNTER — Other Ambulatory Visit: Payer: Self-pay | Admitting: Obstetrics and Gynecology

## 2014-01-31 LAB — CYTOLOGY - PAP

## 2017-02-02 ENCOUNTER — Other Ambulatory Visit: Payer: Self-pay

## 2017-02-02 ENCOUNTER — Encounter (HOSPITAL_BASED_OUTPATIENT_CLINIC_OR_DEPARTMENT_OTHER): Payer: Self-pay

## 2017-02-02 ENCOUNTER — Emergency Department (HOSPITAL_BASED_OUTPATIENT_CLINIC_OR_DEPARTMENT_OTHER)
Admission: EM | Admit: 2017-02-02 | Discharge: 2017-02-02 | Disposition: A | Discharge status: Home / Self Care | Payer: BC Managed Care – PPO | Attending: Emergency Medicine | Admitting: Emergency Medicine

## 2017-02-02 ENCOUNTER — Emergency Department (HOSPITAL_BASED_OUTPATIENT_CLINIC_OR_DEPARTMENT_OTHER): Payer: BC Managed Care – PPO

## 2017-02-02 DIAGNOSIS — R0602 Shortness of breath: Secondary | ICD-10-CM | POA: Diagnosis present

## 2017-02-02 DIAGNOSIS — J40 Bronchitis, not specified as acute or chronic: Secondary | ICD-10-CM | POA: Diagnosis not present

## 2017-02-02 MED ORDER — ALBUTEROL SULFATE HFA 108 (90 BASE) MCG/ACT IN AERS
2.0000 | INHALATION_SPRAY | RESPIRATORY_TRACT | 0 refills | Status: AC | PRN
Start: 2017-02-02 — End: ?

## 2017-02-02 MED ORDER — KETOROLAC TROMETHAMINE 30 MG/ML IJ SOLN: 30.0000 mg | Freq: Once | INTRAMUSCULAR | Status: DC

## 2017-02-02 MED ORDER — IPRATROPIUM BROMIDE HFA 17 MCG/ACT IN AERS
2.0000 | INHALATION_SPRAY | Freq: Once | RESPIRATORY_TRACT | Status: AC
  Administered 2017-02-02: 2 via RESPIRATORY_TRACT
  Filled 2017-02-02: qty 12.9

## 2017-02-02 MED ORDER — ALBUTEROL SULFATE HFA 108 (90 BASE) MCG/ACT IN AERS
2.0000 | INHALATION_SPRAY | Freq: Once | RESPIRATORY_TRACT | Status: AC
  Administered 2017-02-02: 2 via RESPIRATORY_TRACT
  Filled 2017-02-02: qty 6.7

## 2017-02-02 NOTE — ED Triage Notes (Signed)
Pt presents with the c/o SOB and wheezing with rightsided chest pain. Pt able to walk to treatment room with steady gate. Pt speaking in short complete sentences. States she started feeling SOB yesterday but worsened this morning. Pt SpO2 95%

## 2017-02-02 NOTE — ED Notes (Signed)
ED Provider at bedside. 

## 2017-02-02 NOTE — Discharge Instructions (Signed)
Take albuterol as needed every 4 hours for wheezing.  Use ibuprofen or naproxen for any discomfort.

## 2017-02-02 NOTE — ED Provider Notes (Signed)
[facility] EMERGENCY DEPARTMENT Provider Note   CSN: [id number] Arrival date & time: 02/02/17  0557     History   Chief Complaint Chief Complaint  Patient presents with  . Shortness of Breath    HPI [name] is a 41 y.o. female.  HPI  This is a 41 year old female who presents with shortness of breath and wheezing.  Patient reports that she felt like she started wheezing yesterday.  She reports a history of pneumonia in the past with similar symptoms.  She denies any additional upper respiratory symptoms including cough, fever, rhinorrhea.  She did recently have sinusitis.  She states now she feels short of breath when she is laying flat.  She also reports some right-sided chest discomfort.  Denies history of blood clots, recent travel, recent hospitalization.  No history of asthma or COPD.  She is a non-smoker.  Past Medical History:  Diagnosis Date  . No pertinent past medical history     There are no active problems to display for this patient.   Past Surgical History:  Procedure Laterality Date  . CESAREAN SECTION    . CESAREAN SECTION  11/01/2010   Procedure: CESAREAN SECTION;  Surgeon: Zelphia CairoGretchen Adkins;  Location: [facility];  Service: Gynecology;  Laterality: N/A;    OB History    Gravida Para Term Preterm AB Living   3 3 0 3 0 3   SAB TAB Ectopic Multiple Live Births   0 0 0 0 1       Home Medications    Prior to Admission medications   Medication Sig Start Date End Date Taking? Authorizing Provider  albuterol (PROVENTIL HFA;VENTOLIN HFA) 108 (90 Base) MCG/ACT inhaler Inhale 2 puffs into the lungs every 4 (four) hours as needed for wheezing or shortness of breath. 02/02/17   Horton, Mayer Maskerourtney F, MD  calcium carbonate (TUMS - DOSED IN MG ELEMENTAL CALCIUM) 500 MG chewable tablet Chew 1 tablet by mouth daily as needed. For heartburn     [provider]    Family History No family history on file.  Social History Social History    Tobacco Use  . Smoking status: Never Smoker  Substance Use Topics  . Alcohol use: No  . Drug use: No     Allergies   Patient has no known allergies.   Review of Systems Review of Systems  Constitutional: Negative for fever.  HENT: Positive for sinus pressure. Negative for congestion and rhinorrhea.   Respiratory: Positive for shortness of breath and wheezing. Negative for cough.   Cardiovascular: Negative for chest pain, palpitations and leg swelling.  Gastrointestinal: Negative for abdominal pain and vomiting.  All other systems reviewed and are negative.    Physical Exam Updated Vital Signs BP (!) 157/92 (BP Location: Right Arm)   Pulse 95   Resp 14   Ht 5\' 2"  (1.575 m)   Wt 90.7 kg (200 lb)   LMP 01/19/2017   SpO2 100%   BMI 36.58 kg/m   Physical Exam  Constitutional: She is oriented to person, place, and time. She appears well-developed and well-nourished. No distress.  HENT:  Head: Normocephalic and atraumatic.  Cardiovascular: Normal rate, regular rhythm and normal heart sounds.  Pulmonary/Chest: Effort normal. No respiratory distress. She has wheezes.  Occasional expiratory wheeze bilateral lower lobes  Abdominal: Soft. There is no tenderness.  Musculoskeletal:       Right lower leg: She exhibits no edema.  Neurological: She is alert and oriented to person,  place, and time.  Skin: Skin is warm and dry.  Psychiatric: She has a normal mood and affect.  Nursing note and vitals reviewed.    ED Treatments / Results  Labs (all labs ordered are listed, but only abnormal results are displayed) Labs Reviewed - No data to display  EKG  EKG Interpretation  Date/Time:  Wednesday February 02 2017 06:10:47 EST Ventricular Rate:  78 PR Interval:    QRS Duration: 102 QT Interval:  410 QTC Calculation: 467 R Axis:   45 Text Interpretation:  Sinus rhythm LAE, consider biatrial enlargement RSR' in V1 or V2, right VCD or RVH No prior for comparison Confirmed  by Ross MarcusHorton, Courtney ([id number]) on 02/02/2017 6:14:38 AM       Radiology Dg Chest 2 View  Result Date: 02/02/2017 CLINICAL DATA:  Acute onset of shortness of breath, wheezing and right-sided chest pain. EXAM: CHEST  2 VIEW COMPARISON:  None. FINDINGS: The lungs are well-aerated. Pulmonary vascularity is at the upper limits of normal. There is no evidence of focal opacification, pleural effusion or pneumothorax. The heart is normal in size; the mediastinal contour is within normal limits. No acute osseous abnormalities are seen. IMPRESSION: No acute cardiopulmonary process seen. Electronically Signed   By: Roanna RaiderJeffery  Chang M.D.   On: 02/02/2017 06:40    Procedures Procedures (including critical care time)  Medications Ordered in ED Medications  ketorolac (TORADOL) 30 MG/ML injection 30 mg (30 mg Intramuscular Refused 02/02/17 0619)  albuterol (PROVENTIL HFA;VENTOLIN HFA) 108 (90 Base) MCG/ACT inhaler 2 puff (not administered)  ipratropium (ATROVENT HFA) inhaler 2 puff (2 puffs Inhalation Given 02/02/17 0618)     Initial Impression / Assessment and Plan / ED Course  I have reviewed the triage vital signs and the nursing notes.  Pertinent labs & imaging results that were available during my care of the patient were reviewed by me and considered in my medical decision making (see chart for details).     Patient presents with concerns for wheezing and shortness of breath.  She has faint wheezing on exam but otherwise good air movement.  No history of asthma or smoking.  Chest x-ray shows no evidence of pneumonia.  EKG shows no evidence of acute ischemia.  She is low risk for PE.  Only risk factor is birth control; however, given wheezing on exam, suspect this is the culprit.  Patient improved with HFA.  Will discharge home with an inhaler.  Will hold off on steroids as wheezing is mild and improved with inhaler.  After history, exam, and medical workup I feel the patient has been appropriately  medically screened and is safe for discharge home. Pertinent diagnoses were discussed with the patient. Patient was given return precautions.   Final Clinical Impressions(s) / ED Diagnoses   Final diagnoses:  Bronchitis    ED Discharge Orders        Ordered    albuterol (PROVENTIL HFA;VENTOLIN HFA) 108 (90 Base) MCG/ACT inhaler  Every 4 hours PRN     02/02/17 0652       Shon BatonHorton, Courtney F, MD 02/02/17 [phone]

## 2017-02-02 NOTE — ED Notes (Signed)
Patient transported to X-ray 

## 2018-08-04 ENCOUNTER — Encounter (HOSPITAL_COMMUNITY)
Admission: EM | Disposition: A | Discharge status: Home / Self Care | Payer: Self-pay | Source: Home / Self Care | Attending: Emergency Medicine

## 2018-08-04 ENCOUNTER — Observation Stay (HOSPITAL_BASED_OUTPATIENT_CLINIC_OR_DEPARTMENT_OTHER)
Admission: EM | Admit: 2018-08-04 | Discharge: 2018-08-05 | Disposition: A | Discharge status: Home / Self Care | Payer: BC Managed Care – PPO | Attending: Emergency Medicine | Admitting: Emergency Medicine

## 2018-08-04 ENCOUNTER — Emergency Department (HOSPITAL_BASED_OUTPATIENT_CLINIC_OR_DEPARTMENT_OTHER): Payer: BC Managed Care – PPO

## 2018-08-04 ENCOUNTER — Observation Stay (HOSPITAL_COMMUNITY): Payer: BC Managed Care – PPO | Admitting: Certified Registered Nurse Anesthetist

## 2018-08-04 ENCOUNTER — Encounter (HOSPITAL_BASED_OUTPATIENT_CLINIC_OR_DEPARTMENT_OTHER): Payer: Self-pay | Admitting: *Deleted

## 2018-08-04 ENCOUNTER — Observation Stay (HOSPITAL_COMMUNITY): Payer: BC Managed Care – PPO

## 2018-08-04 ENCOUNTER — Other Ambulatory Visit: Payer: Self-pay

## 2018-08-04 DIAGNOSIS — R1011 Right upper quadrant pain: Secondary | ICD-10-CM

## 2018-08-04 DIAGNOSIS — Z79899 Other long term (current) drug therapy: Secondary | ICD-10-CM | POA: Diagnosis not present

## 2018-08-04 DIAGNOSIS — Z7901 Long term (current) use of anticoagulants: Secondary | ICD-10-CM | POA: Insufficient documentation

## 2018-08-04 DIAGNOSIS — I700 Atherosclerosis of aorta: Secondary | ICD-10-CM | POA: Insufficient documentation

## 2018-08-04 DIAGNOSIS — K8012 Calculus of gallbladder with acute and chronic cholecystitis without obstruction: Secondary | ICD-10-CM | POA: Diagnosis not present

## 2018-08-04 DIAGNOSIS — Z98891 History of uterine scar from previous surgery: Secondary | ICD-10-CM | POA: Diagnosis not present

## 2018-08-04 DIAGNOSIS — K810 Acute cholecystitis: Secondary | ICD-10-CM | POA: Diagnosis present

## 2018-08-04 DIAGNOSIS — K8020 Calculus of gallbladder without cholecystitis without obstruction: Secondary | ICD-10-CM

## 2018-08-04 DIAGNOSIS — K819 Cholecystitis, unspecified: Secondary | ICD-10-CM | POA: Diagnosis present

## 2018-08-04 DIAGNOSIS — Z1159 Encounter for screening for other viral diseases: Secondary | ICD-10-CM | POA: Diagnosis not present

## 2018-08-04 HISTORY — DX: Pneumonia, unspecified organism: J18.9

## 2018-08-04 HISTORY — PX: CHOLECYSTECTOMY: SHX55

## 2018-08-04 LAB — COMPREHENSIVE METABOLIC PANEL
ALT: 27 U/L (ref 0–44)
AST: 26 U/L (ref 15–41)
Albumin: 4 g/dL (ref 3.5–5.0)
Alkaline Phosphatase: 53 U/L (ref 38–126)
Anion gap: 12 (ref 5–15)
BUN: 7 mg/dL (ref 6–20)
CO2: 22 mmol/L (ref 22–32)
Calcium: 8.9 mg/dL (ref 8.9–10.3)
Chloride: 103 mmol/L (ref 98–111)
Creatinine, Ser: 0.89 mg/dL (ref 0.44–1.00)
GFR calc Af Amer: >60 mL/min (ref >60)
GFR calc non Af Amer: >60 mL/min (ref >60)
Glucose, Bld: 103 mg/dL — ABNORMAL HIGH (ref 70–99)
Potassium: 3.7 mmol/L (ref 3.5–5.1)
Sodium: 137 mmol/L (ref 135–145)
Total Bilirubin: 0.6 mg/dL (ref 0.3–1.2)
Total Protein: 7.9 g/dL (ref 6.5–8.1)

## 2018-08-04 LAB — SARS CORONAVIRUS 2 AG (30 MIN TAT): SARS Coronavirus 2 Ag: NEGATIVE

## 2018-08-04 LAB — CBC WITH DIFFERENTIAL/PLATELET
Abs Immature Granulocytes: 0.04 10*3/uL (ref 0.00–0.07)
Basophils Absolute: 0 10*3/uL (ref 0.0–0.1)
Basophils Relative: 0 %
Eosinophils Absolute: 0.1 10*3/uL (ref 0.0–0.5)
Eosinophils Relative: 1 %
HCT: 43 % (ref 36.0–46.0)
Hemoglobin: 14.3 g/dL (ref 12.0–15.0)
Immature Granulocytes: 0 %
Lymphocytes Relative: 14 %
Lymphs Abs: 1.6 10*3/uL (ref 0.7–4.0)
MCH: 31.8 pg (ref 26.0–34.0)
MCHC: 33.3 g/dL (ref 30.0–36.0)
MCV: 95.8 fL (ref 80.0–100.0)
Monocytes Absolute: 0.6 10*3/uL (ref 0.1–1.0)
Monocytes Relative: 5 %
Neutro Abs: 9.2 10*3/uL — ABNORMAL HIGH (ref 1.7–7.7)
Neutrophils Relative %: 80 %
Platelets: 196 10*3/uL (ref 150–400)
RBC: 4.49 MIL/uL (ref 3.87–5.11)
RDW: 12.5 % (ref 11.5–15.5)
WBC: 11.6 10*3/uL — ABNORMAL HIGH (ref 4.0–10.5)
nRBC: 0 % (ref 0.0–0.2)

## 2018-08-04 LAB — URINALYSIS, ROUTINE W REFLEX MICROSCOPIC
Bilirubin Urine: NEGATIVE
Glucose, UA: NEGATIVE mg/dL
Ketones, ur: NEGATIVE mg/dL
Leukocytes,Ua: NEGATIVE
Nitrite: NEGATIVE
Protein, ur: NEGATIVE mg/dL
Specific Gravity, Urine: <1.005 — ABNORMAL LOW (ref 1.005–1.030)
pH: 5.5 (ref 5.0–8.0)

## 2018-08-04 LAB — SARS CORONAVIRUS 2 BY RT PCR (HOSPITAL ORDER, PERFORMED IN ~~LOC~~ HOSPITAL LAB)
SARS Coronavirus 2: NEGATIVE
SARS Coronavirus 2: NEGATIVE

## 2018-08-04 LAB — URINALYSIS, MICROSCOPIC (REFLEX)

## 2018-08-04 LAB — MRSA PCR SCREENING: MRSA by PCR: NEGATIVE

## 2018-08-04 LAB — LIPASE, BLOOD: Lipase: 26 U/L (ref 11–51)

## 2018-08-04 LAB — PREGNANCY, URINE: Preg Test, Ur: NEGATIVE

## 2018-08-04 SURGERY — LAPAROSCOPIC CHOLECYSTECTOMY WITH INTRAOPERATIVE CHOLANGIOGRAM
Anesthesia: General

## 2018-08-04 MED ORDER — ENOXAPARIN SODIUM 40 MG/0.4ML ~~LOC~~ SOLN: 40.0000 mg | SUBCUTANEOUS | Status: DC

## 2018-08-04 MED ORDER — IOHEXOL 300 MG/ML  SOLN
INTRAMUSCULAR | Status: DC | PRN
  Administered 2018-08-04: 7 mL

## 2018-08-04 MED ORDER — OXYCODONE HCL 5 MG/5ML PO SOLN: 5.0000 mg | Freq: Once | ORAL | Status: DC | PRN

## 2018-08-04 MED ORDER — DEXAMETHASONE SODIUM PHOSPHATE 10 MG/ML IJ SOLN
INTRAMUSCULAR | Status: DC | PRN
  Administered 2018-08-04: 10 mg via INTRAVENOUS

## 2018-08-04 MED ORDER — HYDRALAZINE HCL 20 MG/ML IJ SOLN: 10.0000 mg | INTRAMUSCULAR | Status: DC | PRN

## 2018-08-04 MED ORDER — SUCCINYLCHOLINE CHLORIDE 200 MG/10ML IV SOSY
PREFILLED_SYRINGE | INTRAVENOUS | Status: DC | PRN
  Administered 2018-08-04: 160 mg via INTRAVENOUS

## 2018-08-04 MED ORDER — SODIUM CHLORIDE 0.9 % IV SOLN
2.0000 g | INTRAVENOUS | Status: DC
  Filled 2018-08-04: qty 20

## 2018-08-04 MED ORDER — FENTANYL CITRATE (PF) 100 MCG/2ML IJ SOLN
INTRAMUSCULAR | Status: AC
  Filled 2018-08-04: qty 2

## 2018-08-04 MED ORDER — DIPHENHYDRAMINE HCL 25 MG PO CAPS: 25.0000 mg | ORAL_CAPSULE | Freq: Four times a day (QID) | ORAL | Status: DC | PRN

## 2018-08-04 MED ORDER — SODIUM CHLORIDE 0.45 % IV SOLN: INTRAVENOUS | Status: DC

## 2018-08-04 MED ORDER — FENTANYL CITRATE (PF) 100 MCG/2ML IJ SOLN
25.0000 ug | INTRAMUSCULAR | Status: DC | PRN
  Administered 2018-08-04 (×3): 50 ug via INTRAVENOUS

## 2018-08-04 MED ORDER — PROPOFOL 10 MG/ML IV BOLUS
INTRAVENOUS | Status: AC
  Filled 2018-08-04: qty 20

## 2018-08-04 MED ORDER — MORPHINE SULFATE (PF) 2 MG/ML IV SOLN
1.0000 mg | INTRAVENOUS | Status: DC | PRN
  Administered 2018-08-04: 2 mg via INTRAVENOUS
  Filled 2018-08-04: qty 1

## 2018-08-04 MED ORDER — FENTANYL CITRATE (PF) 250 MCG/5ML IJ SOLN
INTRAMUSCULAR | Status: AC
  Filled 2018-08-04: qty 5

## 2018-08-04 MED ORDER — DOCUSATE SODIUM 100 MG PO CAPS: 100.0000 mg | ORAL_CAPSULE | Freq: Two times a day (BID) | ORAL | Status: DC

## 2018-08-04 MED ORDER — BUPIVACAINE-EPINEPHRINE (PF) 0.25% -1:200000 IJ SOLN
INTRAMUSCULAR | Status: AC
  Filled 2018-08-04: qty 30

## 2018-08-04 MED ORDER — ONDANSETRON HCL 4 MG/2ML IJ SOLN: 4.0000 mg | Freq: Once | INTRAMUSCULAR | Status: DC | PRN

## 2018-08-04 MED ORDER — SUGAMMADEX SODIUM 200 MG/2ML IV SOLN
INTRAVENOUS | Status: AC
  Filled 2018-08-04: qty 2

## 2018-08-04 MED ORDER — ONDANSETRON HCL 4 MG/2ML IJ SOLN
4.0000 mg | Freq: Once | INTRAMUSCULAR | Status: AC
  Administered 2018-08-04: 4 mg via INTRAVENOUS
  Filled 2018-08-04: qty 2

## 2018-08-04 MED ORDER — SODIUM CHLORIDE 0.9 % IV BOLUS
1000.0000 mL | Freq: Once | INTRAVENOUS | Status: AC
  Administered 2018-08-04: 1000 mL via INTRAVENOUS

## 2018-08-04 MED ORDER — POLYETHYLENE GLYCOL 3350 17 G PO PACK
17.0000 g | PACK | Freq: Every day | ORAL | Status: DC | PRN
  Filled 2018-08-04: qty 1

## 2018-08-04 MED ORDER — ROCURONIUM BROMIDE 10 MG/ML (PF) SYRINGE
PREFILLED_SYRINGE | INTRAVENOUS | Status: AC
  Filled 2018-08-04: qty 10

## 2018-08-04 MED ORDER — ACETAMINOPHEN 325 MG PO TABS: 325.0000 mg | ORAL_TABLET | ORAL | Status: DC | PRN

## 2018-08-04 MED ORDER — ACETAMINOPHEN 325 MG PO TABS
650.0000 mg | ORAL_TABLET | Freq: Four times a day (QID) | ORAL | Status: DC | PRN
  Administered 2018-08-05 (×2): 650 mg via ORAL
  Filled 2018-08-04 (×2): qty 2

## 2018-08-04 MED ORDER — MORPHINE SULFATE (PF) 4 MG/ML IV SOLN
4.0000 mg | Freq: Once | INTRAVENOUS | Status: AC
  Administered 2018-08-04: 4 mg via INTRAVENOUS
  Filled 2018-08-04: qty 1

## 2018-08-04 MED ORDER — MEPERIDINE HCL 50 MG/ML IJ SOLN: 6.2500 mg | INTRAMUSCULAR | Status: DC | PRN

## 2018-08-04 MED ORDER — METHOCARBAMOL 500 MG PO TABS: 500.0000 mg | ORAL_TABLET | Freq: Four times a day (QID) | ORAL | Status: DC | PRN

## 2018-08-04 MED ORDER — PROPOFOL 10 MG/ML IV BOLUS
INTRAVENOUS | Status: DC | PRN
  Administered 2018-08-04: 150 mg via INTRAVENOUS

## 2018-08-04 MED ORDER — ENOXAPARIN SODIUM 40 MG/0.4ML ~~LOC~~ SOLN
40.0000 mg | SUBCUTANEOUS | Status: DC
  Administered 2018-08-05: 07:00:00 40 mg via SUBCUTANEOUS
  Filled 2018-08-04: qty 0.4

## 2018-08-04 MED ORDER — FENTANYL CITRATE (PF) 100 MCG/2ML IJ SOLN
INTRAMUSCULAR | Status: DC | PRN
  Administered 2018-08-04: 50 ug via INTRAVENOUS
  Administered 2018-08-04: 100 ug via INTRAVENOUS
  Administered 2018-08-04 (×2): 50 ug via INTRAVENOUS

## 2018-08-04 MED ORDER — LACTATED RINGERS IR SOLN
Status: DC | PRN
  Administered 2018-08-04: 1000 mL

## 2018-08-04 MED ORDER — LIDOCAINE 2% (20 MG/ML) 5 ML SYRINGE
INTRAMUSCULAR | Status: AC
  Filled 2018-08-04: qty 5

## 2018-08-04 MED ORDER — SUCCINYLCHOLINE CHLORIDE 200 MG/10ML IV SOSY
PREFILLED_SYRINGE | INTRAVENOUS | Status: AC
  Filled 2018-08-04: qty 10

## 2018-08-04 MED ORDER — OXYCODONE HCL 5 MG PO TABS: 5.0000 mg | ORAL_TABLET | Freq: Once | ORAL | Status: DC | PRN

## 2018-08-04 MED ORDER — DIPHENHYDRAMINE HCL 50 MG/ML IJ SOLN: 25.0000 mg | Freq: Four times a day (QID) | INTRAMUSCULAR | Status: DC | PRN

## 2018-08-04 MED ORDER — ACETAMINOPHEN 650 MG RE SUPP: 650.0000 mg | Freq: Four times a day (QID) | RECTAL | Status: DC | PRN

## 2018-08-04 MED ORDER — MIDAZOLAM HCL 5 MG/5ML IJ SOLN
INTRAMUSCULAR | Status: DC | PRN
  Administered 2018-08-04: 2 mg via INTRAVENOUS

## 2018-08-04 MED ORDER — OXYCODONE HCL 5 MG PO TABS
5.0000 mg | ORAL_TABLET | ORAL | Status: DC | PRN
  Filled 2018-08-04: qty 2

## 2018-08-04 MED ORDER — MIDAZOLAM HCL 2 MG/2ML IJ SOLN
INTRAMUSCULAR | Status: AC
  Filled 2018-08-04: qty 2

## 2018-08-04 MED ORDER — LACTATED RINGERS IV SOLN
INTRAVENOUS | Status: DC
  Administered 2018-08-04 (×2): via INTRAVENOUS

## 2018-08-04 MED ORDER — ROCURONIUM BROMIDE 50 MG/5ML IV SOSY
PREFILLED_SYRINGE | INTRAVENOUS | Status: DC | PRN
  Administered 2018-08-04: 10 mg via INTRAVENOUS
  Administered 2018-08-04: 40 mg via INTRAVENOUS

## 2018-08-04 MED ORDER — PANTOPRAZOLE SODIUM 40 MG IV SOLR: 40.0000 mg | Freq: Every day | INTRAVENOUS | Status: DC

## 2018-08-04 MED ORDER — KCL IN DEXTROSE-NACL 20-5-0.45 MEQ/L-%-% IV SOLN
INTRAVENOUS | Status: DC
  Administered 2018-08-05: 03:00:00 via INTRAVENOUS
  Filled 2018-08-04: qty 1000

## 2018-08-04 MED ORDER — SODIUM CHLORIDE 0.9 % IV SOLN
1.0000 g | Freq: Once | INTRAVENOUS | Status: AC
  Administered 2018-08-04: 1 g via INTRAVENOUS
  Filled 2018-08-04: qty 10

## 2018-08-04 MED ORDER — ONDANSETRON HCL 4 MG/2ML IJ SOLN
INTRAMUSCULAR | Status: DC | PRN
  Administered 2018-08-04: 4 mg via INTRAVENOUS

## 2018-08-04 MED ORDER — SIMETHICONE 80 MG PO CHEW
40.0000 mg | CHEWABLE_TABLET | Freq: Four times a day (QID) | ORAL | Status: DC | PRN
  Filled 2018-08-04: qty 1

## 2018-08-04 MED ORDER — ONDANSETRON 4 MG PO TBDP: 4.0000 mg | ORAL_TABLET | Freq: Four times a day (QID) | ORAL | Status: DC | PRN

## 2018-08-04 MED ORDER — BUPIVACAINE-EPINEPHRINE 0.25% -1:200000 IJ SOLN
INTRAMUSCULAR | Status: DC | PRN
  Administered 2018-08-04: 30 mL

## 2018-08-04 MED ORDER — ONDANSETRON HCL 4 MG/2ML IJ SOLN: 4.0000 mg | Freq: Four times a day (QID) | INTRAMUSCULAR | Status: DC | PRN

## 2018-08-04 MED ORDER — ACETAMINOPHEN 160 MG/5ML PO SOLN: 325.0000 mg | ORAL | Status: DC | PRN

## 2018-08-04 MED ORDER — LIDOCAINE 2% (20 MG/ML) 5 ML SYRINGE
INTRAMUSCULAR | Status: DC | PRN
  Administered 2018-08-04: 100 mg via INTRAVENOUS

## 2018-08-04 MED ORDER — MORPHINE SULFATE (PF) 4 MG/ML IV SOLN
1.0000 mg | INTRAVENOUS | Status: DC | PRN
  Administered 2018-08-04: 2 mg via INTRAVENOUS
  Filled 2018-08-04: qty 1

## 2018-08-04 MED ORDER — ONDANSETRON HCL 4 MG/2ML IJ SOLN
INTRAMUSCULAR | Status: AC
  Filled 2018-08-04: qty 2

## 2018-08-04 SURGICAL SUPPLY — 43 items
ADH SKN CLS APL DERMABOND .7 (GAUZE/BANDAGES/DRESSINGS) ×1
APL PRP STRL LF DISP 70% ISPRP (MISCELLANEOUS) ×1
APPLIER CLIP 5 13 M/L LIGAMAX5 (MISCELLANEOUS)
APPLIER CLIP ROT 10 11.4 M/L (STAPLE)
APR CLP MED LRG 11.4X10 (STAPLE)
APR CLP MED LRG 5 ANG JAW (MISCELLANEOUS)
BAG SPEC RTRVL 10 TROC 200 (ENDOMECHANICALS) ×1
CABLE HIGH FREQUENCY MONO STRZ (ELECTRODE) ×2 IMPLANT
CHLORAPREP W/TINT 26 (MISCELLANEOUS) ×2 IMPLANT
CHOLANGIOGRAM CATH TAUT (CATHETERS) ×2 IMPLANT
CLIP APPLIE 5 13 M/L LIGAMAX5 (MISCELLANEOUS) IMPLANT
CLIP APPLIE ROT 10 11.4 M/L (STAPLE) IMPLANT
COVER MAYO STAND STRL (DRAPES) ×2 IMPLANT
COVER SURGICAL LIGHT HANDLE (MISCELLANEOUS) ×2 IMPLANT
COVER WAND RF STERILE (DRAPES) IMPLANT
DECANTER SPIKE VIAL GLASS SM (MISCELLANEOUS) ×2 IMPLANT
DERMABOND ADVANCED (GAUZE/BANDAGES/DRESSINGS) ×1
DERMABOND ADVANCED .7 DNX12 (GAUZE/BANDAGES/DRESSINGS) ×1 IMPLANT
DRAPE C-ARM 42X120 X-RAY (DRAPES) ×2 IMPLANT
ELECT REM PT RETURN 15FT ADLT (MISCELLANEOUS) ×2 IMPLANT
GLOVE SURG SIGNA 7.5 PF LTX (GLOVE) ×2 IMPLANT
GOWN STRL REUS W/TWL XL LVL3 (GOWN DISPOSABLE) ×6 IMPLANT
HEMOSTAT SURGICEL 4X8 (HEMOSTASIS) IMPLANT
IV CATH 14GX2 1/4 (CATHETERS) ×2 IMPLANT
IV SET EXTENSION CATH 6 NF (IV SETS) ×2 IMPLANT
KIT BASIN OR (CUSTOM PROCEDURE TRAY) ×2 IMPLANT
KIT TURNOVER KIT A (KITS) IMPLANT
POUCH RETRIEVAL ECOSAC 10 (ENDOMECHANICALS) ×1 IMPLANT
POUCH RETRIEVAL ECOSAC 10MM (ENDOMECHANICALS) ×1
SCISSORS LAP 5X35 DISP (ENDOMECHANICALS) ×2 IMPLANT
SET IRRIG TUBING LAPAROSCOPIC (IRRIGATION / IRRIGATOR) ×2 IMPLANT
SET TUBE SMOKE EVAC HIGH FLOW (TUBING) ×2 IMPLANT
SLEEVE ADV FIXATION 5X100MM (TROCAR) ×2 IMPLANT
STOPCOCK 4 WAY LG BORE MALE ST (IV SETS) ×2 IMPLANT
STRIP CLOSURE SKIN 1/4X4 (GAUZE/BANDAGES/DRESSINGS) IMPLANT
SUT MNCRL AB 4-0 PS2 18 (SUTURE) ×2 IMPLANT
SYR 10ML ECCENTRIC (SYRINGE) ×2 IMPLANT
TOWEL OR 17X26 10 PK STRL BLUE (TOWEL DISPOSABLE) ×2 IMPLANT
TOWEL OR NON WOVEN STRL DISP B (DISPOSABLE) ×2 IMPLANT
TRAY LAPAROSCOPIC (CUSTOM PROCEDURE TRAY) ×2 IMPLANT
TROCAR ADV FIXATION 11X100MM (TROCAR) IMPLANT
TROCAR ADV FIXATION 5X100MM (TROCAR) ×2 IMPLANT
TROCAR XCEL BLUNT TIP 100MML (ENDOMECHANICALS) ×2 IMPLANT

## 2018-08-04 NOTE — Discharge Instructions (Addendum)
[facility]  [facility]: POST OP INSTRUCTIONS Always review your discharge instruction sheet given to you by the facility where your surgery was performed. IF YOU HAVE DISABILITY OR FAMILY LEAVE FORMS, YOU MUST BRING THEM TO THE OFFICE FOR PROCESSING.   DO NOT GIVE THEM TO YOUR DOCTOR.  PAIN CONTROL  1. First take acetaminophen (Tylenol) AND/or ibuprofen (Advil) to control your pain after surgery.  Follow directions on package.  Taking acetaminophen (Tylenol) and/or ibuprofen (Advil) regularly after surgery will help to control your pain and lower the amount of prescription pain medication you may need.  You should not take more than 4,000 mg (4 grams) of acetaminophen (Tylenol) in 24 hours.  You should not take ibuprofen (Advil), aleve, motrin, naprosyn or other NSAIDS if you have a history of stomach ulcers or chronic kidney disease.  2. A prescription for pain medication may be given to you upon discharge.  Take your pain medication as prescribed, if you still have uncontrolled pain after taking acetaminophen (Tylenol) or ibuprofen (Advil). 3. Use ice packs to help control pain. 4. If you need a refill on your pain medication, please contact your pharmacy.  They will contact our office to request authorization. Prescriptions will not be filled after 5pm or on week-ends.  HOME MEDICATIONS 5. Take your usually prescribed medications unless otherwise directed.  DIET 6. You should follow a light diet the first few days after arrival home.  Be sure to include lots of fluids daily. Avoid fatty, fried foods.   CONSTIPATION 7. It is common to experience some constipation after surgery and if you are taking pain medication.  Increasing fluid intake and taking a stool softener (such as Colace) will usually help or prevent this problem from occurring.  A mild laxative (Milk of Magnesia or Miralax) should be taken according to package instructions if there are no bowel  movements after 48 hours.  WOUND/INCISION CARE 8. Most patients will experience some swelling and bruising in the area of the incisions.  Ice packs will help.  Swelling and bruising can take several days to resolve.  9. Unless discharge instructions indicate otherwise, follow guidelines below  a. STERI-STRIPS - you may remove your outer bandages 48 hours after surgery, and you may shower at that time.  You have steri-strips (small skin tapes) in place directly over the incision.  These strips should be left on the skin for 7-10 days.   b. DERMABOND/SKIN GLUE - you may shower in 24 hours.  The glue will flake off over the next 2-3 weeks. 10. Any sutures or staples will be removed at the office during your follow-up visit.  ACTIVITIES 11. You may resume regular (light) daily activities beginning the next day--such as daily self-care, walking, climbing stairs--gradually increasing activities as tolerated.  You may have sexual intercourse when it is comfortable.  Refrain from any heavy lifting or straining until approved by your doctor. a. You may drive when you are no longer taking prescription pain medication, you can comfortably wear a seatbelt, and you can safely maneuver your car and apply brakes.  FOLLOW-UP 12. You should see your doctor in the office for a follow-up appointment approximately 2-3 weeks after your surgery.  You should have been given your post-op/follow-up appointment when your surgery was scheduled.  If you did not receive a post-op/follow-up appointment, make sure that you call for this appointment within a day or two after you arrive home to insure a convenient appointment time.  OTHER INSTRUCTIONS  WHEN TO CALL YOUR DOCTOR: 1. Fever over 101.0 2. Inability to urinate 3. Continued bleeding from incision. 4. Increased pain, redness, or drainage from the incision. 5. Increasing abdominal pain  The clinic staff is available to answer your questions during regular business  hours.  Please dont hesitate to call and ask to speak to one of the nurses for clinical concerns.  If you have a medical emergency, go to the nearest emergency room or call 911.  A surgeon from [facility] is always on call at the hospital. [address] [place], [place], [place]  [address] ? [address], [place], [place]   [address] [phone] ? [phone] ? FAX [phone]  [facility] - DISCHARGE INSTRUCTIONS TO PATIENT  Activity:  Driving - May drive in 2 to 4 days, if doing well and off pain meds   Lifting - No lifting more than 15 pounds for 10 days, then no limit  Wound Care:   Leave the incision dry until Sunday (6/14), then you may shower  Diet:  As tolerated  Follow up appointment:  Call Dr. Pollie Friar office [facility]) at [phone] for an appointment in 2 to 3 weeks..         We are doing "e" visits post op during this Covid-19 virus epidemic, our office will contact you about this arrangement.  If you have not heard from our office, call our office the day before your scheduled visit to make plans for your visit.  Medications and dosages:  Resume your home medications.  You have a prescription for:  vicodin  Call Dr. Lucia Gaskins or his office  [phone]) if you have:  Temperature greater than 100.4,  Persistent nausea and vomiting,  Severe uncontrolled pain,  Redness, tenderness, or signs of infection (pain, swelling, redness, odor or green/yellow discharge around the site),  Any other questions or concerns you may have after discharge.  In an emergency, call 911 or go to an Emergency Department at a nearby hospital.

## 2018-08-04 NOTE — ED Triage Notes (Signed)
C/o rt flank pain onset last pm w nausea  Denies ua sx

## 2018-08-04 NOTE — Anesthesia Procedure Notes (Signed)
Procedure Name: Intubation Date/Time: 08/04/2018 3:13 PM Performed by: Montel Clock, CRNA Pre-anesthesia Checklist: Patient identified, Emergency Drugs available, Suction available, Patient being monitored and Timeout performed Patient Re-evaluated:Patient Re-evaluated prior to induction Oxygen Delivery Method: Circle system utilized Preoxygenation: Pre-oxygenation with 100% oxygen Induction Type: IV induction and Rapid sequence Laryngoscope Size: Mac and 3 Grade View: Grade II Tube type: Oral Tube size: 7.0 mm Number of attempts: 1 Airway Equipment and Method: Stylet Placement Confirmation: ETT inserted through vocal cords under direct vision,  positive ETCO2 and breath sounds checked- equal and bilateral Secured at: 22 cm Tube secured with: Tape Dental Injury: Teeth and Oropharynx as per pre-operative assessment

## 2018-08-04 NOTE — H&P (Addendum)
[facility] Admission Note  [name] [birth year]  [id number].    Requesting MD: Rolan BuccoMelanie Belfi Chief Complaint: Right flank pain Reason for Consult: Symptomatic cholelithiasis/acute cholecystitis  HPI: Patient is a 43 year old female who noted sudden onset of pain last evening around 7:00.  It has been constant since that time right upper quadrant radiating to her right flank.  Has had some nausea, no vomiting.  No fever she is aware of.  States that she has had some minor RUQ pains in the past but nothing that ever lasted like this. She took ibuprofen without improvement.  Her last mental cycle was 3 days ago. Her only prior surgical procedure was C-sections.     [facility]. Work-up there shows she was afebrile blood pressure 144/79.  Heart rate 84.  CMP is normal except for glucose of 103.  Lipase normal at 26.  WBC 11.6, hemoglobin 14.3, hematocrit 43, platelets 196,000.  UA showed hemoglobin positive, nitrite negative, many bacteria 21-50 squamous cells 0-5 white cells, 0-5 red cells.  Abdominal ultrasound was obtained shows the gallbladder to be somewhat distended, but he said it was negative for cholelithiasis or evidence of acute cholecystitis.  A CT renal stone CT study was then obtained.  This is done without IV contrast.  This shows a 9 mm calcified stone in the neck of the gallbladder.  Gallbladder was moderately distended there is also a tiny calcified gallstones near the fundus of the gallbladder measuring 4 mm.  We were called and patient is [facility].  No significant PMH Nonsmoker Employment: 1st grade teacher   ROS: Review of Systems  Constitutional: Negative for chills and fever.  HENT: Negative.   Eyes: Negative.   Respiratory: Negative.   Cardiovascular: Negative.   Gastrointestinal: Positive for abdominal pain, constipation and nausea. Negative for diarrhea  and vomiting.  Genitourinary: Negative.   Musculoskeletal: Negative.   Skin: Negative.   Neurological: Negative.     No family history on file.  Past Medical History:  Diagnosis Date  . No pertinent past medical history   . Pneumonia     Past Surgical History:  Procedure Laterality Date  . CESAREAN SECTION    . CESAREAN SECTION  11/01/2010   Procedure: CESAREAN SECTION;  Surgeon: Zelphia CairoGretchen Adkins;  Location: [facility];  Service: Gynecology;  Laterality: N/A;    Social History:  reports that she has never smoked. She does not have any smokeless tobacco history on file. She reports that she does not drink alcohol or use drugs.  Allergies: No Known Allergies  Prior to Admission medications   Medication Sig Start Date End Date Taking? Authorizing Provider  albuterol (PROVENTIL HFA;VENTOLIN HFA) 108 (90 Base) MCG/ACT inhaler Inhale 2 puffs into the lungs every 4 (four) hours as needed for wheezing or shortness of breath. 02/02/17   Horton, Mayer Maskerourtney F, MD  calcium carbonate (TUMS - DOSED IN MG ELEMENTAL CALCIUM) 500 MG chewable tablet Chew 1 tablet by mouth daily as needed. For heartburn     [provider]     Blood pressure 117/65, pulse 62, temperature 98.4 F (36.9 C), temperature source Oral, resp. rate 16, height 5\' 2"  (1.575 m), weight 93 kg, last menstrual period 07/25/2018, SpO2 100 %, unknown if currently breastfeeding. Physical Exam: Physical Exam Constitutional:      General: She is not in acute distress.    Appearance: Normal appearance. She is obese. She  is not toxic-appearing.  HENT:     Head: Normocephalic and atraumatic.     Nose: Nose normal.     Mouth/Throat:     Mouth: Mucous membranes are dry.  Eyes:     General: No scleral icterus.    Extraocular Movements: Extraocular movements intact.     Conjunctiva/sclera: Conjunctivae normal.     Pupils: Pupils are equal, round, and reactive to light.  Cardiovascular:     Rate and Rhythm: Normal rate and  regular rhythm.     Pulses: Normal pulses.  Pulmonary:     Effort: Pulmonary effort is normal. No respiratory distress.     Breath sounds: Normal breath sounds. No stridor. No wheezing, rhonchi or rales.  Chest:     Chest wall: No tenderness.  Abdominal:     General: Bowel sounds are normal.     Palpations: There is no hepatomegaly, splenomegaly or mass.     Tenderness: There is abdominal tenderness in the right upper quadrant and epigastric area. There is guarding. There is no rebound. Negative signs include Murphy's sign.     Hernia: No hernia is present.  Musculoskeletal:     Right lower leg: No edema.     Left lower leg: No edema.  Skin:    General: Skin is warm and dry.     Capillary Refill: Capillary refill takes less than 2 seconds.     Coloration: Skin is not jaundiced.     Findings: No rash.  Neurological:     General: No focal deficit present.     Mental Status: She is alert and oriented to person, place, and time.     Cranial Nerves: No cranial nerve deficit.  Psychiatric:        Mood and Affect: Mood normal.        Behavior: Behavior normal.     Results for orders placed or performed during the hospital encounter of 08/04/18 (from the past 48 hour(s))  Urinalysis, Routine w reflex microscopic     Status: Abnormal   Collection Time: 08/04/18  7:30 AM  Result Value Ref Range   Color, Urine YELLOW YELLOW   APPearance CLEAR CLEAR   Specific Gravity, Urine <1.005 (L) 1.005 - 1.030   pH 5.5 5.0 - 8.0   Glucose, UA NEGATIVE NEGATIVE mg/dL   Hgb urine dipstick LARGE (A) NEGATIVE   Bilirubin Urine NEGATIVE NEGATIVE   Ketones, ur NEGATIVE NEGATIVE mg/dL   Protein, ur NEGATIVE NEGATIVE mg/dL   Nitrite NEGATIVE NEGATIVE   Leukocytes,Ua NEGATIVE NEGATIVE    Comment: Performed at [facility], [address] [place], [place] [address]  Pregnancy, urine     Status: None   Collection Time: 08/04/18  7:30 AM  Result Value Ref Range   Preg Test, Ur NEGATIVE  NEGATIVE    Comment:        THE SENSITIVITY OF THIS METHODOLOGY IS >20 mIU/mL. Performed at [facility], [address] [place], [place] [address]   Urinalysis, Microscopic (reflex)     Status: Abnormal   Collection Time: 08/04/18  7:30 AM  Result Value Ref Range   RBC / HPF 0-5 0 - 5 RBC/hpf   WBC, UA 0-5 0 - 5 WBC/hpf   Bacteria, UA MANY (A) NONE SEEN   Squamous Epithelial / LPF 21-50 0 - 5    Comment: Performed at [facility], [address] [place], [place] [address]  Lipase, blood  Status: None   Collection Time: 08/04/18  7:39 AM  Result Value Ref Range   Lipase 26 11 - 51 U/L    Comment: Performed at [facility], [address] [place], [place] [address]  CBC with Differential     Status: Abnormal   Collection Time: 08/04/18  7:40 AM  Result Value Ref Range   WBC 11.6 (H) 4.0 - 10.5 K/uL   RBC 4.49 3.87 - 5.11 MIL/uL   Hemoglobin 14.3 12.0 - 15.0 g/dL   HCT 60.443.0 54.036.0 - 98.146.0 %   MCV 95.8 80.0 - 100.0 fL   MCH 31.8 26.0 - 34.0 pg   MCHC 33.3 30.0 - 36.0 g/dL   RDW 19.112.5 47.811.5 - 29.515.5 %   Platelets 196 150 - 400 K/uL   nRBC 0.0 0.0 - 0.2 %   Neutrophils Relative % 80 %   Neutro Abs 9.2 (H) 1.7 - 7.7 K/uL   Lymphocytes Relative 14 %   Lymphs Abs 1.6 0.7 - 4.0 K/uL   Monocytes Relative 5 %   Monocytes Absolute 0.6 0.1 - 1.0 K/uL   Eosinophils Relative 1 %   Eosinophils Absolute 0.1 0.0 - 0.5 K/uL   Basophils Relative 0 %   Basophils Absolute 0.0 0.0 - 0.1 K/uL   Immature Granulocytes 0 %   Abs Immature Granulocytes 0.04 0.00 - 0.07 K/uL    Comment: Performed at [facility], [address] [place], [place] [address]  Comprehensive metabolic panel     Status: Abnormal   Collection Time: 08/04/18  7:40 AM  Result Value Ref Range   Sodium 137 135 - 145 mmol/L   Potassium 3.7 3.5 - 5.1 mmol/L   Chloride 103 98 - 111 mmol/L   CO2 22 22 - 32 mmol/L   Glucose, Bld 103 (H) 70 - 99 mg/dL   BUN 7 6 - 20 mg/dL    Creatinine, Ser 8.650.89 0.44 - 1.00 mg/dL   Calcium 8.9 8.9 - 78.410.3 mg/dL   Total Protein 7.9 6.5 - 8.1 g/dL   Albumin 4.0 3.5 - 5.0 g/dL   AST 26 15 - 41 U/L   ALT 27 0 - 44 U/L   Alkaline Phosphatase 53 38 - 126 U/L   Total Bilirubin 0.6 0.3 - 1.2 mg/dL   GFR calc non Af Amer >60 >60 mL/min   GFR calc Af Amer >60 >60 mL/min   Anion gap 12 5 - 15    Comment: Performed at [facility], [address] [place], [place] [address]   Ct Renal Larina BrasStone Study  Result Date: 08/04/2018 CLINICAL DATA:  43 year old female with history of right upper quadrant abdominal pain and nausea. Pain radiates into the right flank pain for the past 1 day. EXAM: CT ABDOMEN AND PELVIS WITHOUT CONTRAST TECHNIQUE: Multidetector CT imaging of the abdomen and pelvis was performed following the standard protocol without IV contrast. COMPARISON:  No priors. FINDINGS: Lower chest: Unremarkable. Hepatobiliary: No definite suspicious cystic or solid hepatic lesions are confidently identified on today's noncontrast CT examination. In the neck of the gallbladder there is a 9 mm calcified gallstone. Gallbladder is moderately distended. Gallbladder wall appears slightly hazy, concerning for acute inflammation. Tiny calcified gallstone near the fundus of the gallbladder measuring 4 mm. Pancreas: No definite pancreatic mass or peripancreatic fluid collections or inflammatory changes are noted on today's noncontrast CT examination. Spleen: Unremarkable. Adrenals/Urinary Tract: There are no abnormal calcifications within the collecting system  of either kidney, along the course of either ureter, or within the lumen of the urinary bladder. No hydroureteronephrosis or perinephric stranding to suggest urinary tract obstruction at this time. The unenhanced appearance of the kidneys is unremarkable bilaterally. Unenhanced appearance of the urinary bladder is normal. Bilateral adrenal glands are normal in appearance. Stomach/Bowel: Normal  appearance of the stomach. No pathologic dilatation of small bowel or colon. Normal appendix. Vascular/Lymphatic: Aortic atherosclerosis. No lymphadenopathy noted in the abdomen or pelvis. Reproductive: Uterus and ovaries are unremarkable in appearance. Other: No significant volume of ascites.  No pneumoperitoneum. Musculoskeletal: There are no aggressive appearing lytic or blastic lesions noted in the visualized portions of the skeleton. IMPRESSION: 1. Cholelithiasis, including a 9 mm stone in the neck of the gallbladder with moderate gallbladder distension and slight haziness in the pericholecystic fat, which could be a very early sign of acute cholecystitis. 2. Aortic atherosclerosis. Electronically Signed   By: Trudie Reedaniel  Entrikin M.D.   On: 08/04/2018 09:52   [place] Abdomen Limited Ruq  Result Date: 08/04/2018 CLINICAL DATA:  43 year old female with a history of right upper quadrant pain EXAM: ULTRASOUND ABDOMEN LIMITED RIGHT UPPER QUADRANT COMPARISON:  None. FINDINGS: Gallbladder: Gallbladder appears somewhat distended, with anechoic fluid. No significant gallbladder wall thickening. Negative sonographic Murphy's sign. Small hyperechoic focus adherent to the gallbladder wall measuring 6 mm x 3 mm x 5 mm. Common bile duct: Diameter: 3 mm-4 mm Liver: No focal lesion identified. Within normal limits in parenchymal echogenicity. Portal vein is patent on color Doppler imaging with normal direction of blood flow towards the liver. IMPRESSION: Sonographic survey negative for cholelithiasis or sonographic evidence of acute cholecystitis. Distention gallbladder of uncertain significance. Correlation with lab values may be useful. Gallbladder wall polyp measures 6 mm. Repeat ultrasound in 1 year is indicated. Electronically Signed   By: Gilmer MorJaime  Wagner D.O.   On: 08/04/2018 09:06   Anti-infectives (From admission, onward)   Start     Dose/Rate Route Frequency Ordered Stop   08/04/18 1045  cefTRIAXone (ROCEPHIN) 2 g in  sodium chloride 0.9 % 100 mL IVPB     2 g 200 mL/hr over 30 Minutes Intravenous Every 24 hours 08/04/18 1033     08/04/18 1030  cefTRIAXone (ROCEPHIN) 1 g in sodium chloride 0.9 % 100 mL IVPB     1 g 200 mL/hr over 30 Minutes Intravenous  Once 08/04/18 1020 08/04/18 1112        Assessment/Plan Hx C-section  Acute cholecystitis/cholelithiasis - Patient with radiographic and clinical signs of acute cholecystitis. Will start IV rocephin and plan for laparoscopic cholecystectomy today. Keep NPO,  ID: rocephin FEN: IVF, NPO Foley: none Follow up: TBD Covid: negative Contact: husband Mardelle Mattendy [phone]   Franne FortsBrooke A Meuth, [facility] 08/04/2018, 1:28 PM Pager: [phone]  Agree with above. Appears to have acute cholecystitis from an impacted stone in the neck of her gall bladder.  I discussed with the patient the indications and risks of gall bladder surgery.  The primary risks of gall bladder surgery include, but are not limited to, bleeding, infection, common bile duct injury, and open surgery.  There is also the risk that the patient may have continued symptoms after surgery.  We discussed the typical post-operative recovery course. I tried to answer the patient's questions.  I spoke to her husband, Mardelle Mattendy, by phone about the planned surgery.  Ovidio Kinavid Aunica Dauphinee, MD, [facility] Pager: [phone] Office phone:  [phone]

## 2018-08-04 NOTE — ED Notes (Signed)
Patient transported to Ultrasound 

## 2018-08-04 NOTE — Progress Notes (Signed)
Received patient back from PACU. Drowsy but oriented. 5 port sites with glue dry and intact. Donne Hazel, RN

## 2018-08-04 NOTE — ED Provider Notes (Signed)
[facility] EMERGENCY DEPARTMENT Provider Note   CSN: [id number] Arrival date & time: 08/04/18  0715    History   Chief Complaint Chief Complaint  Patient presents with  . Flank Pain    HPI [name] is a 43 y.o. female.     Patient is a 43 year old female who presents with right flank pain.  She states that started suddenly about 7:00 PM last night.  It is been constant since that time.  It is in her right upper abdomen and radiates to the right flank little bit.  She has no known fevers.  No urinary symptoms.  She does have some associated nausea and vomiting.  She has had prior C-sections but no other abdominal surgeries.  She took ibuprofen without improvement in symptoms.  Her last menstrual.  Was 3 days ago but she said it was very slight.     Past Medical History:  Diagnosis Date  . No pertinent past medical history   . Pneumonia     Patient Active Problem List   Diagnosis Date Noted  . Acute cholecystitis 08/04/2018    Past Surgical History:  Procedure Laterality Date  . CESAREAN SECTION    . CESAREAN SECTION  11/01/2010   Procedure: CESAREAN SECTION;  Surgeon: Zelphia CairoGretchen Adkins;  Location: [facility];  Service: Gynecology;  Laterality: N/A;     OB History    Gravida  3   Para  3   Term  0   Preterm  3   AB  0   Living  3     SAB  0   TAB  0   Ectopic  0   Multiple  0   Live Births  1            Home Medications    Prior to Admission medications   Medication Sig Start Date End Date Taking? Authorizing Provider  albuterol (PROVENTIL HFA;VENTOLIN HFA) 108 (90 Base) MCG/ACT inhaler Inhale 2 puffs into the lungs every 4 (four) hours as needed for wheezing or shortness of breath. 02/02/17   Horton, Mayer Maskerourtney F, MD  calcium carbonate (TUMS - DOSED IN MG ELEMENTAL CALCIUM) 500 MG chewable tablet Chew 1 tablet by mouth daily as needed. For heartburn     [provider]    Family History No family history on file.   Social History Social History   Tobacco Use  . Smoking status: Never Smoker  Substance Use Topics  . Alcohol use: No  . Drug use: No     Allergies   Patient has no known allergies.   Review of Systems Review of Systems  Constitutional: Negative for chills, diaphoresis, fatigue and fever.  HENT: Negative for congestion, rhinorrhea and sneezing.   Eyes: Negative.   Respiratory: Negative for cough, chest tightness and shortness of breath.   Cardiovascular: Negative for chest pain and leg swelling.  Gastrointestinal: Positive for abdominal pain, nausea and vomiting. Negative for blood in stool and diarrhea.  Genitourinary: Negative for difficulty urinating, flank pain, frequency and hematuria.  Musculoskeletal: Negative for arthralgias and back pain.  Skin: Negative for rash.  Neurological: Negative for dizziness, speech difficulty, weakness, numbness and headaches.     Physical Exam Updated Vital Signs BP 117/65 (BP Location: Right Arm)   Pulse 62   Temp 98.4 F (36.9 C) (Oral)   Resp 16   Ht 5\' 2"  (1.575 m)   Wt 93 kg   LMP 07/25/2018 (Exact Date)   SpO2 100%  BMI 37.49 kg/m   Physical Exam Constitutional:      Appearance: She is well-developed.  HENT:     Head: Normocephalic and atraumatic.  Eyes:     Pupils: Pupils are equal, round, and reactive to light.  Neck:     Musculoskeletal: Normal range of motion and neck supple.  Cardiovascular:     Rate and Rhythm: Normal rate and regular rhythm.     Heart sounds: Normal heart sounds.  Pulmonary:     Effort: Pulmonary effort is normal. No respiratory distress.     Breath sounds: Normal breath sounds. No wheezing or rales.  Chest:     Chest wall: No tenderness.  Abdominal:     General: Bowel sounds are normal.     Palpations: Abdomen is soft.     Tenderness: There is abdominal tenderness (Right upper quadrant tenderness). There is no guarding or rebound.  Musculoskeletal: Normal range of motion.   Lymphadenopathy:     Cervical: No cervical adenopathy.  Skin:    General: Skin is warm and dry.     Findings: No rash.  Neurological:     Mental Status: She is alert and oriented to person, place, and time.      ED Treatments / Results  Labs (all labs ordered are listed, but only abnormal results are displayed) Labs Reviewed  CBC WITH DIFFERENTIAL/PLATELET - Abnormal; Notable for the following components:      Result Value   WBC 11.6 (*)    Neutro Abs 9.2 (*)    All other components within normal limits  COMPREHENSIVE METABOLIC PANEL - Abnormal; Notable for the following components:   Glucose, Bld 103 (*)    All other components within normal limits  URINALYSIS, ROUTINE W REFLEX MICROSCOPIC - Abnormal; Notable for the following components:   Specific Gravity, Urine <1.005 (*)    Hgb urine dipstick LARGE (*)    All other components within normal limits  URINALYSIS, MICROSCOPIC (REFLEX) - Abnormal; Notable for the following components:   Bacteria, UA MANY (*)    All other components within normal limits  SARS CORONAVIRUS 2 (HOSP ORDER, PERFORMED IN Pink LAB VIA ABBOTT ID)  NOVEL CORONAVIRUS, NAA (HOSPITAL ORDER, SEND-OUT TO REF LAB)  LIPASE, BLOOD  PREGNANCY, URINE  HIV ANTIBODY (ROUTINE TESTING W REFLEX)    EKG None  Radiology Ct Renal Stone Study  Result Date: 08/04/2018 CLINICAL DATA:  43 year old female with history of right upper quadrant abdominal pain and nausea. Pain radiates into the right flank pain for the past 1 day. EXAM: CT ABDOMEN AND PELVIS WITHOUT CONTRAST TECHNIQUE: Multidetector CT imaging of the abdomen and pelvis was performed following the standard protocol without IV contrast. COMPARISON:  No priors. FINDINGS: Lower chest: Unremarkable. Hepatobiliary: No definite suspicious cystic or solid hepatic lesions are confidently identified on today's noncontrast CT examination. In the neck of the gallbladder there is a 9 mm calcified gallstone.  Gallbladder is moderately distended. Gallbladder wall appears slightly hazy, concerning for acute inflammation. Tiny calcified gallstone near the fundus of the gallbladder measuring 4 mm. Pancreas: No definite pancreatic mass or peripancreatic fluid collections or inflammatory changes are noted on today's noncontrast CT examination. Spleen: Unremarkable. Adrenals/Urinary Tract: There are no abnormal calcifications within the collecting system of either kidney, along the course of either ureter, or within the lumen of the urinary bladder. No hydroureteronephrosis or perinephric stranding to suggest urinary tract obstruction at this time. The unenhanced appearance of the kidneys is unremarkable bilaterally. Unenhanced appearance  of the urinary bladder is normal. Bilateral adrenal glands are normal in appearance. Stomach/Bowel: Normal appearance of the stomach. No pathologic dilatation of small bowel or colon. Normal appendix. Vascular/Lymphatic: Aortic atherosclerosis. No lymphadenopathy noted in the abdomen or pelvis. Reproductive: Uterus and ovaries are unremarkable in appearance. Other: No significant volume of ascites.  No pneumoperitoneum. Musculoskeletal: There are no aggressive appearing lytic or blastic lesions noted in the visualized portions of the skeleton. IMPRESSION: 1. Cholelithiasis, including a 9 mm stone in the neck of the gallbladder with moderate gallbladder distension and slight haziness in the pericholecystic fat, which could be a very early sign of acute cholecystitis. 2. Aortic atherosclerosis. Electronically Signed   By: Trudie Reedaniel  Entrikin M.D.   On: 08/04/2018 09:52   [place] Abdomen Limited Ruq  Result Date: 08/04/2018 CLINICAL DATA:  43 year old female with a history of right upper quadrant pain EXAM: ULTRASOUND ABDOMEN LIMITED RIGHT UPPER QUADRANT COMPARISON:  None. FINDINGS: Gallbladder: Gallbladder appears somewhat distended, with anechoic fluid. No significant gallbladder wall thickening.  Negative sonographic Murphy's sign. Small hyperechoic focus adherent to the gallbladder wall measuring 6 mm x 3 mm x 5 mm. Common bile duct: Diameter: 3 mm-4 mm Liver: No focal lesion identified. Within normal limits in parenchymal echogenicity. Portal vein is patent on color Doppler imaging with normal direction of blood flow towards the liver. IMPRESSION: Sonographic survey negative for cholelithiasis or sonographic evidence of acute cholecystitis. Distention gallbladder of uncertain significance. Correlation with lab values may be useful. Gallbladder wall polyp measures 6 mm. Repeat ultrasound in 1 year is indicated. Electronically Signed   By: Gilmer MorJaime  Wagner D.O.   On: 08/04/2018 09:06    Procedures Procedures (including critical care time)  Medications Ordered in ED Medications  enoxaparin (LOVENOX) injection 40 mg (has no administration in time range)  cefTRIAXone (ROCEPHIN) 2 g in sodium chloride 0.9 % 100 mL IVPB (has no administration in time range)  hydrALAZINE (APRESOLINE) injection 10 mg (has no administration in time range)  pantoprazole (PROTONIX) injection 40 mg (has no administration in time range)  simethicone (MYLICON) chewable tablet 40 mg (has no administration in time range)  ondansetron (ZOFRAN-ODT) disintegrating tablet 4 mg (has no administration in time range)    Or  ondansetron (ZOFRAN) injection 4 mg (has no administration in time range)  docusate sodium (COLACE) capsule 100 mg (has no administration in time range)  polyethylene glycol (MIRALAX / GLYCOLAX) packet 17 g (has no administration in time range)  diphenhydrAMINE (BENADRYL) capsule 25 mg (has no administration in time range)    Or  diphenhydrAMINE (BENADRYL) injection 25 mg (has no administration in time range)  methocarbamol (ROBAXIN) tablet 500 mg (has no administration in time range)  morphine 2 MG/ML injection 1-2 mg (has no administration in time range)  oxyCODONE (Oxy IR/ROXICODONE) immediate release  tablet 5-10 mg (has no administration in time range)  acetaminophen (TYLENOL) tablet 650 mg (has no administration in time range)    Or  acetaminophen (TYLENOL) suppository 650 mg (has no administration in time range)  0.45 % sodium chloride infusion (has no administration in time range)  cefTRIAXone (ROCEPHIN) 1 g in sodium chloride 0.9 % 100 mL IVPB (1 g Intravenous New Bag/Given 08/04/18 1032)  morphine 4 MG/ML injection 4 mg (4 mg Intravenous Given 08/04/18 0810)  ondansetron (ZOFRAN) injection 4 mg (4 mg Intravenous Given 08/04/18 0811)  sodium chloride 0.9 % bolus 1,000 mL (0 mLs Intravenous Stopped 08/04/18 0940)     Initial Impression / Assessment and Plan /  ED Course  I have reviewed the triage vital signs and the nursing notes.  Pertinent labs & imaging results that were available during my care of the patient were reviewed by me and considered in my medical decision making (see chart for details).        Patient is a 43 year old female who presents with right upper quadrant abdominal pain.  Initially she had an ultrasound performed which showed no evidence of gallstones or cholecystitis.  They did note a gallbladder polyp.  Given her significant symptoms, I discussed with her doing a CT scan which she was wanting to do.  A CT scan actually shows that she does have a large 9 mm gallstone in the neck of the gallbladder with signs of early cholecystitis.  Her white count is mildly elevated.  She is afebrile.  Her LFTs are normal.  Her lipase is normal.  There is no suggestions of pancreatitis.  I spoke with Jerene Pitch who is the general surgery PA covering for [facility] long hospital who recommends admission for cholecystectomy.  She has accepted the patient for Dr. Lucia Gaskins for admission to [facility] long.  I discussed these findings with the patient.  She was given a dose of IV Rocephin and pain management.  Final Clinical Impressions(s) / ED Diagnoses   Final diagnoses:  RUQ pain  Cholecystitis     ED Discharge Orders    None       Malvin Johns, MD 08/04/18 1049

## 2018-08-04 NOTE — Anesthesia Postprocedure Evaluation (Signed)
Anesthesia Post Note  Patient: [name]  Procedure(s) Performed: LAPAROSCOPIC CHOLECYSTECTOMY WITH INTRAOPERATIVE CHOLANGIOGRAM (N/A )     Patient location during evaluation: PACU Anesthesia Type: General Level of consciousness: awake and alert Pain management: pain level controlled Vital Signs Assessment: post-procedure vital signs reviewed and stable Respiratory status: spontaneous breathing, nonlabored ventilation, respiratory function stable and patient connected to nasal cannula oxygen Cardiovascular status: blood pressure returned to baseline and stable Postop Assessment: no apparent nausea or vomiting Anesthetic complications: no    Last Vitals:  Vitals:   08/04/18 1954 08/04/18 2039  BP: 123/77 115/71  Pulse: 70 66  Resp: 17 18  Temp: 36.9 C 36.9 C  SpO2: 95% 96%    Last Pain:  Vitals:   08/04/18 2040  TempSrc:   PainSc: 4                  Tavon Magnussen

## 2018-08-04 NOTE — Anesthesia Preprocedure Evaluation (Signed)
Anesthesia Evaluation  Patient identified by MRN, date of birth, ID band Patient awake    Reviewed: Allergy & Precautions, H&P , Patient's Chart, lab work & pertinent test results  Airway Mallampati: III  TM Distance: >3 FB Neck ROM: full    Dental no notable dental hx.    Pulmonary neg pulmonary ROS,    Pulmonary exam normal breath sounds clear to auscultation       Cardiovascular negative cardio ROS   Rhythm:regular Rate:Normal     Neuro/Psych negative neurological ROS  negative psych ROS   GI/Hepatic negative GI ROS, Neg liver ROS,   Endo/Other  negative endocrine ROS  Renal/GU negative Renal ROS     Musculoskeletal   Abdominal   Peds  Hematology negative hematology ROS (+)   Anesthesia Other Findings   Reproductive/Obstetrics (+) Pregnancy                             Anesthesia Physical  Anesthesia Plan  ASA: III  Anesthesia Plan: General   Post-op Pain Management:    Induction: Intravenous  PONV Risk Score and Plan:   Airway Management Planned: Oral ETT and LMA  Additional Equipment:   Intra-op Plan:   Post-operative Plan: Extubation in OR  Informed Consent: I have reviewed the patients History and Physical, chart, labs and discussed the procedure including the risks, benefits and alternatives for the proposed anesthesia with the patient or authorized representative who has indicated his/her understanding and acceptance.       Plan Discussed with: CRNA, Anesthesiologist and Surgeon  Anesthesia Plan Comments: (  )        Anesthesia Quick Evaluation

## 2018-08-04 NOTE — ED Notes (Signed)
Patient transported to CT 

## 2018-08-04 NOTE — Op Note (Signed)
08/04/2018  4:45 PM  PATIENT:  Anita BurowAlicia [name], 43 y.o., female, MRN: [id number]  PREOP DIAGNOSIS:  cholelithiasis  POSTOP DIAGNOSIS:   Acute/chronic cholecystitis, cholelithiasis  PROCEDURE:   Procedure(s): LAPAROSCOPIC CHOLECYSTECTOMY WITH INTRAOPERATIVE CHOLANGIOGRAM  SURGEON:   Ovidio Kinavid Maurie Olesen, M.D.  Threasa HeadsASSISTANMilford Cage:   C. Connor, M.D.  ANESTHESIA:   general  Anesthesiologist: Bethena Midgetddono, Ernest, MD CRNA: Epimenio SarinJarvela, Joshua R, CRNA  General  ASA: 2E  EBL:  Minimal  ml  BLOOD ADMINISTERED: none  DRAINS: none   LOCAL MEDICATIONS USED:   30 cc of 1/4% marcaine  SPECIMEN:   Gall bladder   COUNTS CORRECT:  YES  INDICATIONS FOR PROCEDURE:  [name] is a 43 y.o. (DOB: [birth year]) white female whose primary care physician is Marcelle OverlieGrewal, Michelle, MD and comes for cholecystectomy.   The indications and risks of the gall bladder surgery were explained to the patient.  The risks include, but are not limited to, infection, bleeding, common bile duct injury and open surgery.  SURGERY:  The patient was [facility].  The abdomen was prepped with chloroprep.  The patient was given Rocephin prior to the beginning of the operation.   A time out was held and the surgical checklist run.   An infraumbilical incision was made into the abdominal cavity.  A 12 mm Hasson trocar was inserted into the abdominal cavity through the infraumbilical incision and secured with a 0 Vicryl suture.  Three additional trocars were inserted: a 10 mm trocar in the sub-xiphoid location, a 5 mm trocar in the right mid subcostal area, and a 5 mm trocar in the right lateral subcostal area.   The abdomen was explored and the liver, stomach, and bowel that could be seen were unremarkable.   The gall bladder was acutely/chronically inflamed.  It was edematous.   I grasped the gall bladder and rotated it cephalad.  Disssection was carried down to the gall bladder/cystic duct junction and the cystic  duct isolated.  A clip was placed on the gall bladder side of the cystic duct.   An intra-operative cholangiogram was shot.   The intra-operative cholangiogram was shot using a cut off Taut catheter placed through a 14 gauge angiocath in the RUQ.  The Taut catheter was inserted in the cut cystic duct and secured with an endoclip.  A cholangiogram was shot with 8 cc of 1/2 strength Isoview.  Using fluoroscopy, the cholangiogram showed the flow of contrast into the common bile duct, and up the hepatic radicals.  There was no mass or obstruction.  The cholangiogram emptied in a odd way into the 3rd/4th portion of the duodenum.   But was otherwise normal.   The Taut catheter was removed.  The cystic duct was tripley endoclipped and the cystic artery was identified and clipped.  The gall bladder was bluntly and sharpley dissected from the gall bladder bed.   After the gall bladder was removed from the liver, the gall bladder bed and Triangle of Calot were inspected.  There was no bleeding or bile leak.  The gall bladder was placed in a Ecco Sac bag and delivered through the umbilicus.  The abdomen was irrigated with 800 cc saline.   The trocars were then removed.  I infiltrated 30cc of 1/4% Marcaine into the incisions.  The umbilical port closed with a 0 Vicryl suture and the skin closed with 4-0 Monocryl.  The skin was painted with DermaBond.  The patient's sponge and needle count  were correct.  The patient was transported to the RR in good condition.  Alphonsa Overall, MD, [facility] Surgery Pager: [phone] Office phone:  [phone]

## 2018-08-04 NOTE — Transfer of Care (Signed)
Immediate Anesthesia Transfer of Care Note  Patient: [name]  Procedure(s) Performed: LAPAROSCOPIC CHOLECYSTECTOMY WITH INTRAOPERATIVE CHOLANGIOGRAM (N/A )  Patient Location: PACU  Anesthesia Type:General  Level of Consciousness: drowsy and patient cooperative  Airway & Oxygen Therapy: Patient Spontanous Breathing and Patient connected to face mask oxygen  Post-op Assessment: Report given to RN and Post -op Vital signs reviewed and stable  Post vital signs: Reviewed and stable  Last Vitals:  Vitals Value Taken Time  BP 131/79 08/04/18 1655  Temp    Pulse 82 08/04/18 1658  Resp 22 08/04/18 1658  SpO2 100 % 08/04/18 1658  Vitals shown include unvalidated device data.  Last Pain:  Vitals:   08/04/18 1340  TempSrc: Oral  PainSc:          Complications: No apparent anesthesia complications

## 2018-08-05 ENCOUNTER — Encounter (HOSPITAL_COMMUNITY): Payer: Self-pay | Admitting: Surgery

## 2018-08-05 MED ORDER — HYDROCODONE-ACETAMINOPHEN 5-325 MG PO TABS: 1.0000 | ORAL_TABLET | Freq: Four times a day (QID) | ORAL | 0 refills | Status: AC | PRN

## 2018-08-05 NOTE — Plan of Care (Signed)
Patient lying in bed this morning; States pain is well-controlled at this time. Anticipating discharge home today. Will continue to monitor.

## 2018-08-05 NOTE — Plan of Care (Signed)
Reviewed discharge instructions with patient; copy given. IV removed. Patient ready for discharge.  

## 2018-08-05 NOTE — Op Note (Deleted)
08/04/2018  35:57 AM  PATIENT:  [name], 43 y.o., female, MRN: [id number]  PREOP DIAGNOSIS:  Cholecystitis, cholelithiasis  POSTOP DIAGNOSIS:   Purulent cholecystitis with early infarction of gall bladder, cholelithiasis  PROCEDURE:   Procedure(s): LAPAROSCOPIC CHOLECYSTECTOMY WITH INTRAOPERATIVE CHOLANGIOGRAM  SURGEON:   Alphonsa Overall, M.D.  ASSISTANT:   None  ANESTHESIA:   general  Anesthesiologist: Janeece Riggers, MD CRNA: Montel Clock, CRNA  General  ASA: 3E  EBL:  minimal  ml  BLOOD ADMINISTERED: none  DRAINS: none   LOCAL MEDICATIONS USED:   30 cc 1/4% marcaine  SPECIMEN:   Gall bladder  COUNTS CORRECT:  YES  INDICATIONS FOR PROCEDURE:  [name] is a 43 y.o. (DOB: 1975/10/29) white female whose primary care physician is Dian Queen, MD and comes for cholecystectomy.   The indications and risks of the gall bladder surgery were explained to the patient.  The risks include, but are not limited to, infection, bleeding, common bile duct injury and open surgery.  SURGERY:  The patient was taken to OR room #1 at [facility].  The abdomen was prepped with chloroprep.  The patient was given Rocephin prior to the beginning of the operation.   A time out was held and the surgical checklist run.   An infraumbilical incision was made into the abdominal cavity.  A 12 mm Hasson trocar was inserted into the abdominal cavity through the infraumbilical incision and secured with a 0 Vicryl suture.  Three additional trocars were inserted: a 10 mm trocar in the sub-xiphoid location, a 5 mm trocar in the right mid subcostal area, and a 5 mm trocar in the right lateral subcostal area.   The abdomen was explored and the liver, stomach, and bowel that could be seen were unremarkable.   The gall bladder was acutely inflamed with some mild purulence.  It was encased in omentum that had tried to wall it off.  And there were some spots on the gall bladder  consistent with infarction.  I decompressed the gall bladder.   I grasped the gall bladder and rotated it cephalad.  Disssection was carried down to the gall bladder/cystic duct junction and the cystic duct isolated.  The patient had a short cystic duct.   A clip was placed on the gall bladder side of the cystic duct.   An intra-operative cholangiogram was shot.   The intra-operative cholangiogram was shot using a cut off Taut catheter placed through a 14 gauge angiocath in the RUQ.  The Taut catheter was inserted in the cut cystic duct and secured with an endoclip.  A cholangiogram was shot with 10 cc of 1/2 strength Isoview.  Using fluoroscopy, the cholangiogram showed the flow of contrast into the common bile duct, up the hepatic radicals, and into the duodenum.  The patient had late filling of the right hepatic duct.  There was no mass or obstruction.  This was a normal intra-operative cholangiogram.   The Taut catheter was removed.  The cystic duct was tripley endoclipped and the cystic artery was identified and clipped.  The gall bladder was bluntly and sharpley dissected from the gall bladder bed.   After the gall bladder was removed from the liver, the gall bladder bed and Triangle of Calot were inspected.  There was no bleeding or bile leak.  The gall bladder was placed in a Ecco Sac bag and delivered through the umbilicus.  The abdomen was irrigated with 2,000 cc saline.   The trocars  were then removed.  I infiltrated 30 cc of 1/4% Marcaine into the incisions.  The umbilical port closed with a 0 Vicryl suture and the skin closed with 4-0 Monocryl.  The skin was painted with DermaBond.  The patient's sponge and needle count were correct.  The patient was transported to the RR in good condition.  Ovidio Kinavid Andrus Sharp, MD, [facility] Pager: [phone] Office phone:  [phone]

## 2018-08-05 NOTE — Discharge Summary (Signed)
Physician Discharge Summary  Patient ID:  [name]  MRN: [id number]  DOB/AGE: 42-02-1975 42 y.o.  Admit date: 08/04/2018 Discharge date: 08/05/2018  Discharge Diagnoses:   Active Problems:   Acute cholecystitis   Operation: Procedure(s): LAPAROSCOPIC CHOLECYSTECTOMY WITH INTRAOPERATIVE CHOLANGIOGRAM on 08/04/2018 - D. Ezzard StandingNewman  Discharged Condition: good  Hospital Course: [name] is an 43 y.o. female whose primary care physician is Marcelle OverlieGrewal, Michelle, MD and who was admitted 08/04/2018 with a chief complaint of  Chief Complaint  Patient presents with  . Flank Pain  .   She was brought to the operating room on 08/04/2018 and underwent  LAPAROSCOPIC CHOLECYSTECTOMY WITH INTRAOPERATIVE CHOLANGIOGRAM.  She is now one day post op.  She is doing well and ready to go home. She does cross fit - I told her to hold off for 10 - 14 days. 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   The discharge instructions were reviewed with the patient.  Consults: None  Significant Diagnostic Studies: Results for orders placed or performed during the hospital encounter of 08/04/18  SARS Coronavirus 2 (Hosp order,Performed [facility] via [employer])   Specimen: Dry Nasal Swab ([employer] Now)  Result Value Ref Range   SARS Coronavirus 2 ([employer] Now) NEGATIVE NEGATIVE  MRSA PCR Screening   Specimen: Nasal Mucosa; Nasopharyngeal  Result Value Ref Range   MRSA by PCR NEGATIVE NEGATIVE  SARS Coronavirus 2 (CEPHEID - [facility]formed [facility]), [facility]   Specimen: Nasopharyngeal Swab  Result Value Ref Range   SARS Coronavirus 2 NEGATIVE NEGATIVE  SARS Coronavirus 2 (CEPHEID - [facility]   Specimen: Nasopharyngeal Swab  Result Value Ref Range   SARS Coronavirus 2 NEGATIVE NEGATIVE  CBC with Differential  Result Value Ref Range   WBC 11.6 (H) 4.0 - 10.5 K/uL   RBC 4.49 3.87 - 5.11 MIL/uL   Hemoglobin 14.3  12.0 - 15.0 g/dL   HCT 30.843.0 65.736.0 - 84.646.0 %   MCV 95.8 80.0 - 100.0 fL   MCH 31.8 26.0 - 34.0 pg   MCHC 33.3 30.0 - 36.0 g/dL   RDW 96.212.5 95.211.5 - 84.115.5 %   Platelets 196 150 - 400 K/uL   nRBC 0.0 0.0 - 0.2 %   Neutrophils Relative % 80 %   Neutro Abs 9.2 (H) 1.7 - 7.7 K/uL   Lymphocytes Relative 14 %   Lymphs Abs 1.6 0.7 - 4.0 K/uL   Monocytes Relative 5 %   Monocytes Absolute 0.6 0.1 - 1.0 K/uL   Eosinophils Relative 1 %   Eosinophils Absolute 0.1 0.0 - 0.5 K/uL   Basophils Relative 0 %   Basophils Absolute 0.0 0.0 - 0.1 K/uL   Immature Granulocytes 0 %   Abs Immature Granulocytes 0.04 0.00 - 0.07 K/uL  Comprehensive metabolic panel  Result Value Ref Range   Sodium 137 135 - 145 mmol/L   Potassium 3.7 3.5 - 5.1 mmol/L   Chloride 103 98 - 111 mmol/L   CO2 22 22 - 32 mmol/L   Glucose, Bld 103 (H) 70 - 99 mg/dL   BUN 7 6 - 20 mg/dL   Creatinine, Ser 3.240.89 0.44 - 1.00 mg/dL   Calcium 8.9 8.9 - 40.110.3 mg/dL   Total Protein 7.9 6.5 - 8.1 g/dL   Albumin 4.0 3.5 - 5.0 g/dL   AST 26 15 - 41 U/L   ALT  27 0 - 44 U/L   Alkaline Phosphatase 53 38 - 126 U/L   Total Bilirubin 0.6 0.3 - 1.2 mg/dL   GFR calc non Af Amer >60 >60 mL/min   GFR calc Af Amer >60 >60 mL/min   Anion gap 12 5 - 15  Urinalysis, Routine w reflex microscopic  Result Value Ref Range   Color, Urine YELLOW YELLOW   APPearance CLEAR CLEAR   Specific Gravity, Urine <1.005 (L) 1.005 - 1.030   pH 5.5 5.0 - 8.0   Glucose, UA NEGATIVE NEGATIVE mg/dL   Hgb urine dipstick LARGE (A) NEGATIVE   Bilirubin Urine NEGATIVE NEGATIVE   Ketones, ur NEGATIVE NEGATIVE mg/dL   Protein, ur NEGATIVE NEGATIVE mg/dL   Nitrite NEGATIVE NEGATIVE   Leukocytes,Ua NEGATIVE NEGATIVE  Lipase, blood  Result Value Ref Range   Lipase 26 11 - 51 U/L  Pregnancy, urine  Result Value Ref Range   Preg Test, Ur NEGATIVE NEGATIVE  Urinalysis, Microscopic (reflex)  Result Value Ref Range   RBC / HPF 0-5 0 - 5 RBC/hpf   WBC, UA 0-5 0 - 5 WBC/hpf    Bacteria, UA MANY (A) NONE SEEN   Squamous Epithelial / LPF 21-50 0 - 5    Dg Cholangiogram Operative  Result Date: 08/04/2018 CLINICAL DATA:  43 year old female with a history of cholelithiasis/cholecystitis EXAM: INTRAOPERATIVE CHOLANGIOGRAM TECHNIQUE: Cholangiographic images from the C-arm fluoroscopic device were submitted for interpretation post-operatively. Please see the procedural report for the amount of contrast and the fluoroscopy time utilized. COMPARISON:  CT 08/04/2018 FINDINGS: Surgical instruments project over the upper abdomen. There is cannulation of the cystic duct/gallbladder neck, with antegrade infusion of contrast. Caliber of the extrahepatic ductal system within normal limits. Variant of the extrahepatic ductal system, with the pancreatic duct draining both the extrahepatic ducts and the pancreatic ducts into the third portion of the duodenum. No definite filling defect within the extrahepatic ducts identified. Free flow of contrast across the ampulla. IMPRESSION: Intraoperative cholangiogram demonstrates extrahepatic biliary ducts of unremarkable caliber, with no definite filling defects identified. Free flow of contrast across the ampulla. Note that there is variant anatomy of the extrahepatic ducts and pancreatic duct, with the ampulla into the third portion of the duodenum. Please refer to the dictated operative report for full details of intraoperative findings and procedure Electronically Signed   By: Gilmer MorJaime  Wagner D.O.   On: 08/04/2018 16:26   Ct Renal Stone Study  Result Date: 08/04/2018 CLINICAL DATA:  43 year old female with history of right upper quadrant abdominal pain and nausea. Pain radiates into the right flank pain for the past 1 day. EXAM: CT ABDOMEN AND PELVIS WITHOUT CONTRAST TECHNIQUE: Multidetector CT imaging of the abdomen and pelvis was performed following the standard protocol without IV contrast. COMPARISON:  No priors. FINDINGS: Lower chest: Unremarkable.  Hepatobiliary: No definite suspicious cystic or solid hepatic lesions are confidently identified on today's noncontrast CT examination. In the neck of the gallbladder there is a 9 mm calcified gallstone. Gallbladder is moderately distended. Gallbladder wall appears slightly hazy, concerning for acute inflammation. Tiny calcified gallstone near the fundus of the gallbladder measuring 4 mm. Pancreas: No definite pancreatic mass or peripancreatic fluid collections or inflammatory changes are noted on today's noncontrast CT examination. Spleen: Unremarkable. Adrenals/Urinary Tract: There are no abnormal calcifications within the collecting system of either kidney, along the course of either ureter, or within the lumen of the urinary bladder. No hydroureteronephrosis or perinephric stranding to suggest urinary tract obstruction at  this time. The unenhanced appearance of the kidneys is unremarkable bilaterally. Unenhanced appearance of the urinary bladder is normal. Bilateral adrenal glands are normal in appearance. Stomach/Bowel: Normal appearance of the stomach. No pathologic dilatation of small bowel or colon. Normal appendix. Vascular/Lymphatic: Aortic atherosclerosis. No lymphadenopathy noted in the abdomen or pelvis. Reproductive: Uterus and ovaries are unremarkable in appearance. Other: No significant volume of ascites.  No pneumoperitoneum. Musculoskeletal: There are no aggressive appearing lytic or blastic lesions noted in the visualized portions of the skeleton. IMPRESSION: 1. Cholelithiasis, including a 9 mm stone in the neck of the gallbladder with moderate gallbladder distension and slight haziness in the pericholecystic fat, which could be a very early sign of acute cholecystitis. 2. Aortic atherosclerosis. Electronically Signed   By: Trudie Reedaniel  Entrikin M.D.   On: 08/04/2018 09:52   [place] Abdomen Limited Ruq  Result Date: 08/04/2018 CLINICAL DATA:  43 year old female with a history of right upper quadrant  pain EXAM: ULTRASOUND ABDOMEN LIMITED RIGHT UPPER QUADRANT COMPARISON:  None. FINDINGS: Gallbladder: Gallbladder appears somewhat distended, with anechoic fluid. No significant gallbladder wall thickening. Negative sonographic Murphy's sign. Small hyperechoic focus adherent to the gallbladder wall measuring 6 mm x 3 mm x 5 mm. Common bile duct: Diameter: 3 mm-4 mm Liver: No focal lesion identified. Within normal limits in parenchymal echogenicity. Portal vein is patent on color Doppler imaging with normal direction of blood flow towards the liver. IMPRESSION: Sonographic survey negative for cholelithiasis or sonographic evidence of acute cholecystitis. Distention gallbladder of uncertain significance. Correlation with lab values may be useful. Gallbladder wall polyp measures 6 mm. Repeat ultrasound in 1 year is indicated. Electronically Signed   By: Gilmer MorJaime  Wagner D.O.   On: 08/04/2018 09:06    Discharge Exam:  Vitals:   08/05/18 0152 08/05/18 0647  BP: 123/70 108/63  Pulse: 64 (!) 59  Resp: 17 18  Temp: 98.6 F (37 C) 98.8 F (37.1 C)  SpO2: 98% 97%    General: Obese WF who is alert and generally healthy appearing.  Lungs: Clear to auscultation and symmetric breath sounds. Heart:  RRR. No murmur or rub. Abdomen: Soft. No mass. No hernia. Normal bowel sounds.  Incisions look good.  Discharge Medications:   Allergies as of 08/05/2018   No Known Allergies     Medication List    TAKE these medications   albuterol 108 (90 Base) MCG/ACT inhaler Commonly known as: VENTOLIN HFA Inhale 2 puffs into the lungs every 4 (four) hours as needed for wheezing or shortness of breath.   calcium carbonate 500 MG chewable tablet Commonly known as: TUMS - dosed in mg elemental calcium Chew 1 tablet by mouth daily as needed. For heartburn   HYDROcodone-acetaminophen 5-325 MG tablet Commonly known as: NORCO/VICODIN Take 1 tablet by mouth every 6 (six) hours as needed for moderate pain.   Wymzya Fe  0.4-35 MG-MCG tablet Generic drug: Norethin-Eth Estradiol-Fe Chew 1 tablet by mouth daily.       Disposition: Discharge disposition: 01-Home or Self Care       Discharge Instructions    Diet - low sodium heart healthy   Complete by: As directed    Increase activity slowly   Complete by: As directed       Follow-up Information    [facility] Go on 08/22/2018.   Specialty: General Surgery Why: 6/30 at 10:30. Due to coronavirus we are decreasing foot traffic in office. Instead of coming to an appt a provider will call you  at the above date/time. Send picture of your incision with your name and date of birth to [email or web address] Contact information: [address] [place] [phone]           Signed: Alphonsa Overall, M.D., [facility] Office:  [phone]  08/05/2018, 9:33 AM

## 2018-08-06 LAB — HIV ANTIBODY (ROUTINE TESTING W REFLEX): HIV Screen 4th Generation wRfx: NONREACTIVE

## 2019-02-02 ENCOUNTER — Other Ambulatory Visit: Payer: Self-pay

## 2019-02-02 DIAGNOSIS — Z20822 Contact with and (suspected) exposure to covid-19: Secondary | ICD-10-CM

## 2019-02-04 LAB — NOVEL CORONAVIRUS, NAA: SARS-CoV-2, NAA: NOT DETECTED

## 2021-02-11 IMAGING — CT CT RENAL STONE PROTOCOL
2 of 4 series · 16 of 46 positions shown, 18 images · non-contrast
Comparison: No priors.

CLINICAL DATA: 43-year-old female with history of right upper
quadrant abdominal pain and nausea. Pain radiates into the right
flank pain for the past 1 day.

EXAM:
CT ABDOMEN AND PELVIS WITHOUT CONTRAST
TECHNIQUE: Multidetector CT imaging of the abdomen and pelvis was performed
following the standard protocol without IV contrast.

[Series 2: axial st · axial · 0.85mm/px · z∈[-468,-38]mm · 13 of 94 slices shown, 15 images]
[im 4/94  soft-tissue]
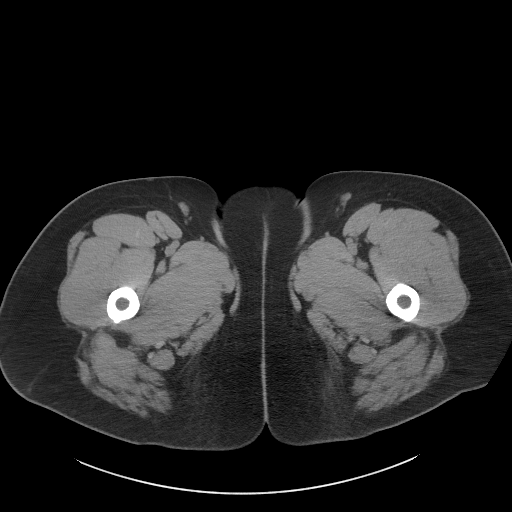
[im 4/94  bone]
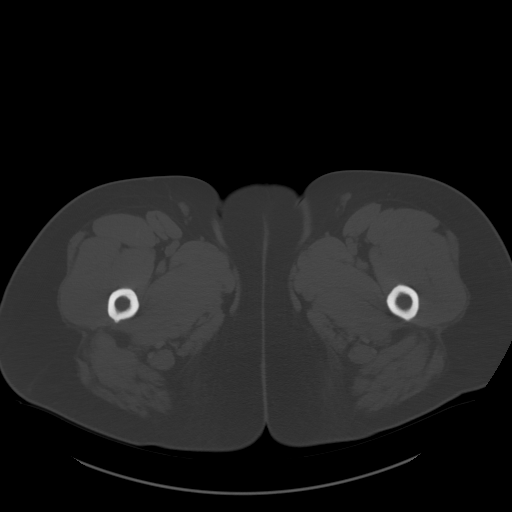
[im 12/94  soft-tissue]
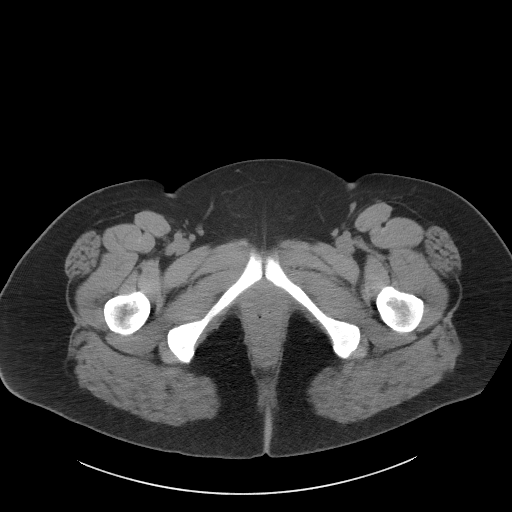
[im 19/94  soft-tissue]
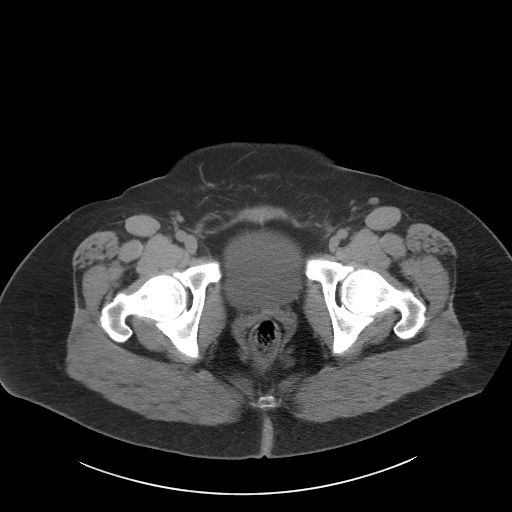
[im 27/94  soft-tissue]
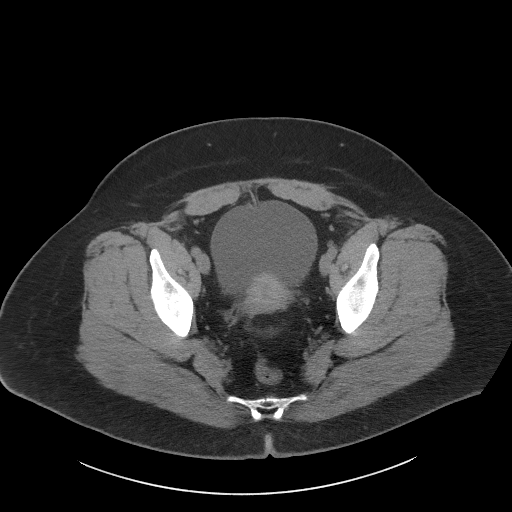
[im 34/94  soft-tissue]
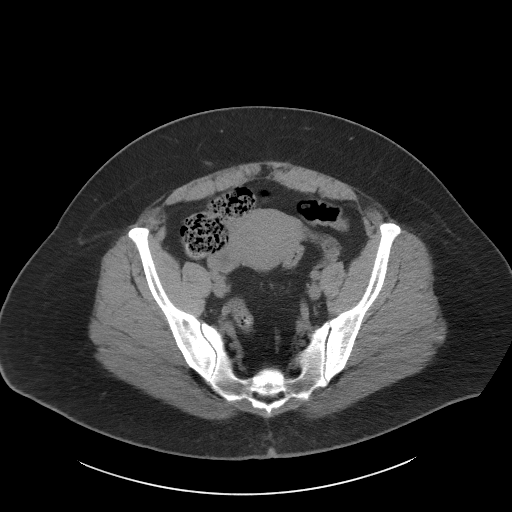
[im 41/94  soft-tissue]
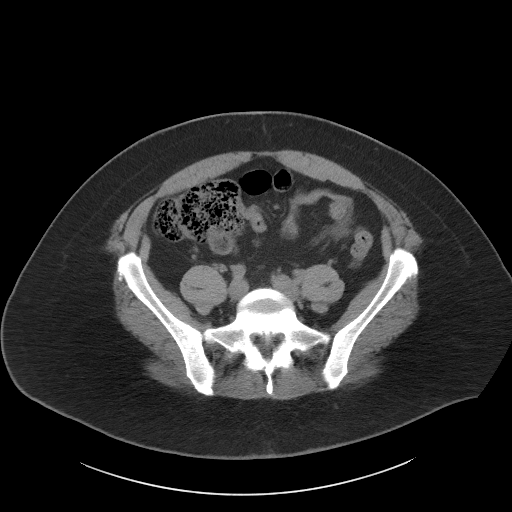
[im 49/94  soft-tissue]
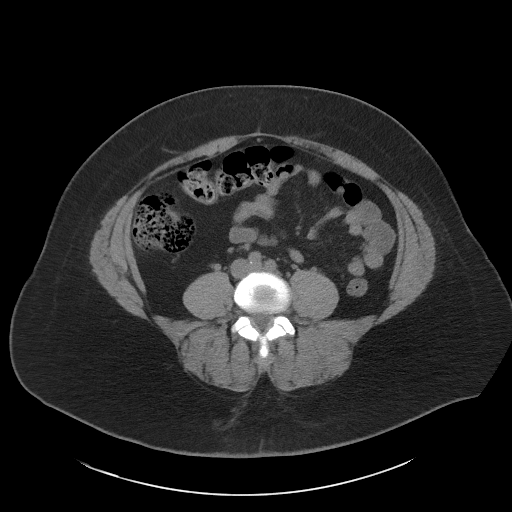
[im 53/94  soft-tissue]
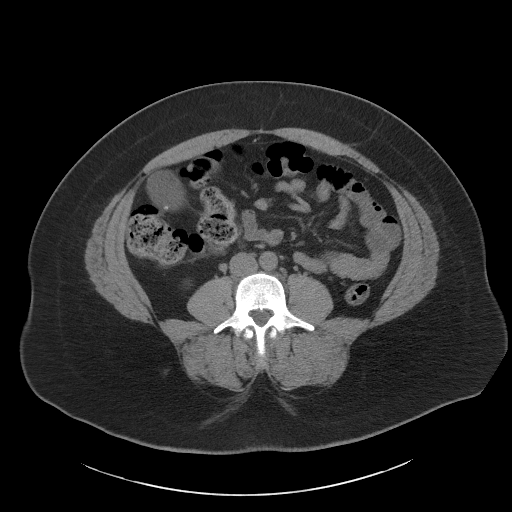
[im 60/94  soft-tissue]
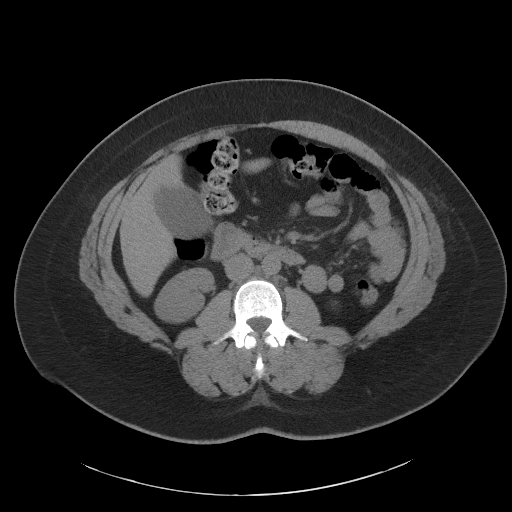
[im 60/94  bone]
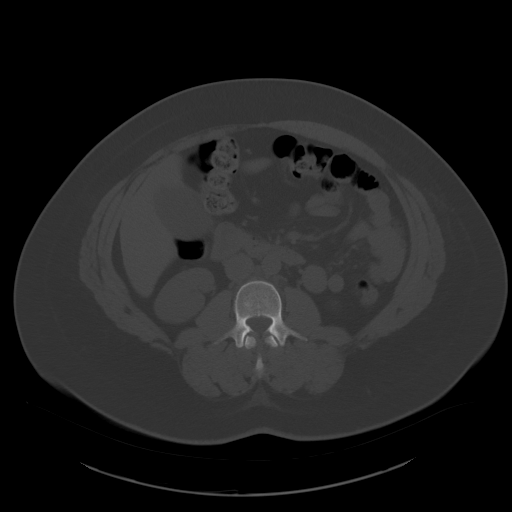
[im 67/94  soft-tissue]
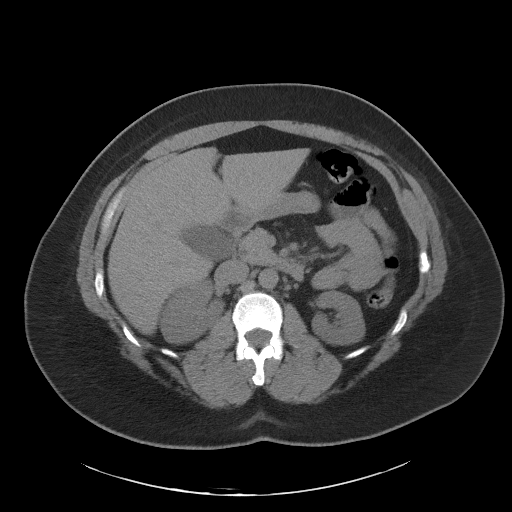
[im 75/94  soft-tissue]
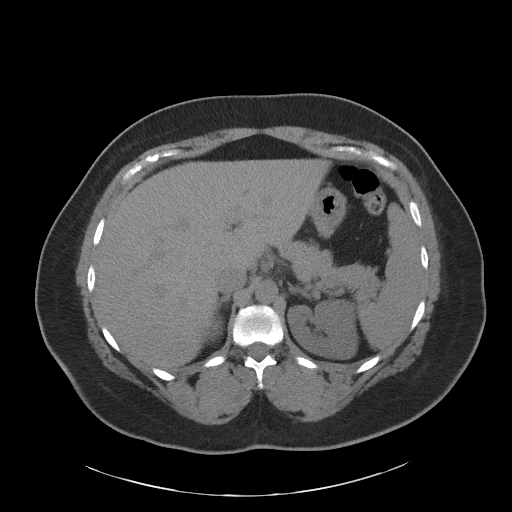
[im 82/94  soft-tissue]
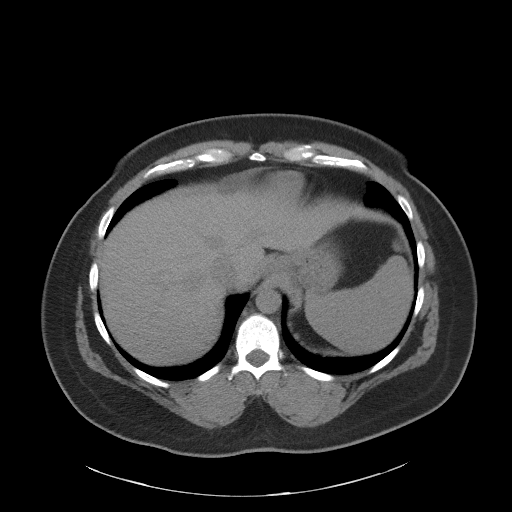
[im 90/94  soft-tissue]
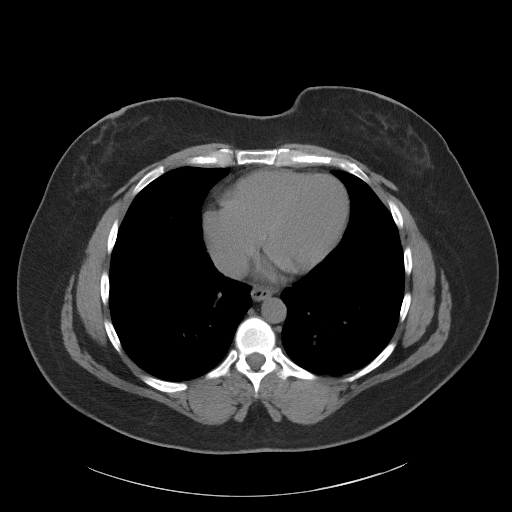

[Series 5: coronal st · coronal · 0.93mm/px · 3 of 101 slices shown]
[im 34/101  soft-tissue]
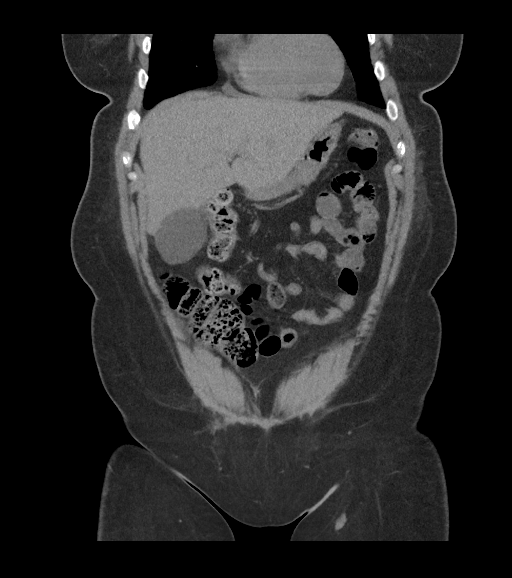
[im 45/101  soft-tissue]
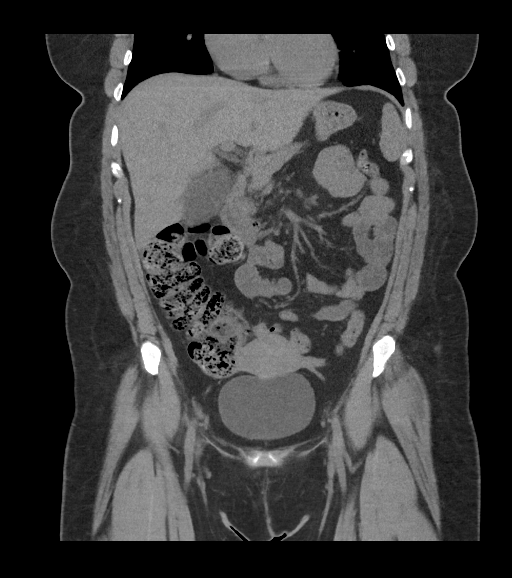
[im 56/101  soft-tissue]
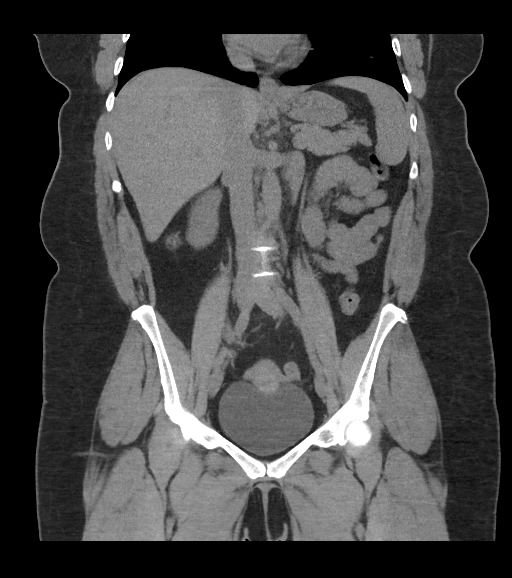

[16 of 46 positions shown; findings below may reference images not displayed]

FINDINGS: Lower chest: Unremarkable.

Hepatobiliary: No definite suspicious cystic or solid hepatic
lesions are confidently identified on today's noncontrast CT
examination. In the neck of the gallbladder there is a 9 mm
calcified gallstone. Gallbladder is moderately distended.
Gallbladder wall appears slightly hazy, concerning for acute
inflammation. Tiny calcified gallstone near the fundus of the
gallbladder measuring 4 mm.

Pancreas: No definite pancreatic mass or peripancreatic fluid
collections or inflammatory changes are noted on today's noncontrast
CT examination.

Spleen: Unremarkable.

Adrenals/Urinary Tract: There are no abnormal calcifications within
the collecting system of either kidney, along the course of either
ureter, or within the lumen of the urinary bladder. No
hydroureteronephrosis or perinephric stranding to suggest urinary
tract obstruction at this time. The unenhanced appearance of the
kidneys is unremarkable bilaterally. Unenhanced appearance of the
urinary bladder is normal. Bilateral adrenal glands are normal in
appearance.

Stomach/Bowel: Normal appearance of the stomach. No pathologic
dilatation of small bowel or colon. Normal appendix.

Vascular/Lymphatic: Aortic atherosclerosis. No lymphadenopathy noted
in the abdomen or pelvis.

Reproductive: Uterus and ovaries are unremarkable in appearance.

Other: No significant volume of ascites.  No pneumoperitoneum.

Musculoskeletal: There are no aggressive appearing lytic or blastic
lesions noted in the visualized portions of the skeleton.
IMPRESSION: 1. Cholelithiasis, including a 9 mm stone in the neck of the
gallbladder with moderate gallbladder distension and slight haziness
in the pericholecystic fat, which could be a very early sign of
acute cholecystitis.
2. Aortic atherosclerosis.

## 2021-02-11 IMAGING — RF INTRAOPERATIVE CHOLANGIOGRAM
1 series · 4 of 4 positions shown · non-contrast
Comparison: CT 08/04/2018

CLINICAL DATA: 43-year-old female with a history of
cholelithiasis/cholecystitis

EXAM:
INTRAOPERATIVE CHOLANGIOGRAM
TECHNIQUE: Cholangiographic images from the C-arm fluoroscopic device were
submitted for interpretation post-operatively. Please see the
procedural report for the amount of contrast and the fluoroscopy
time utilized.

[Series 1: run · 4 of 68 frames shown]
[frame 11/68]
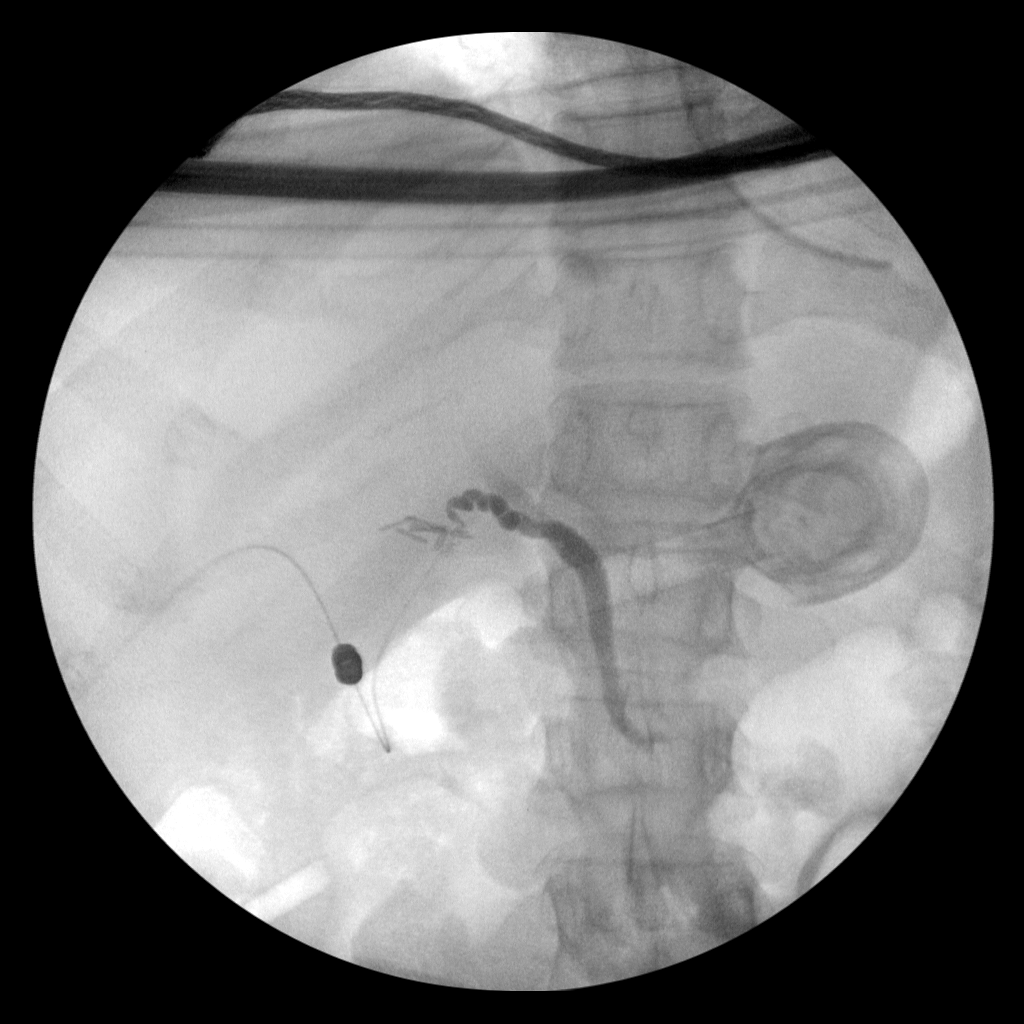
[frame 35/68]
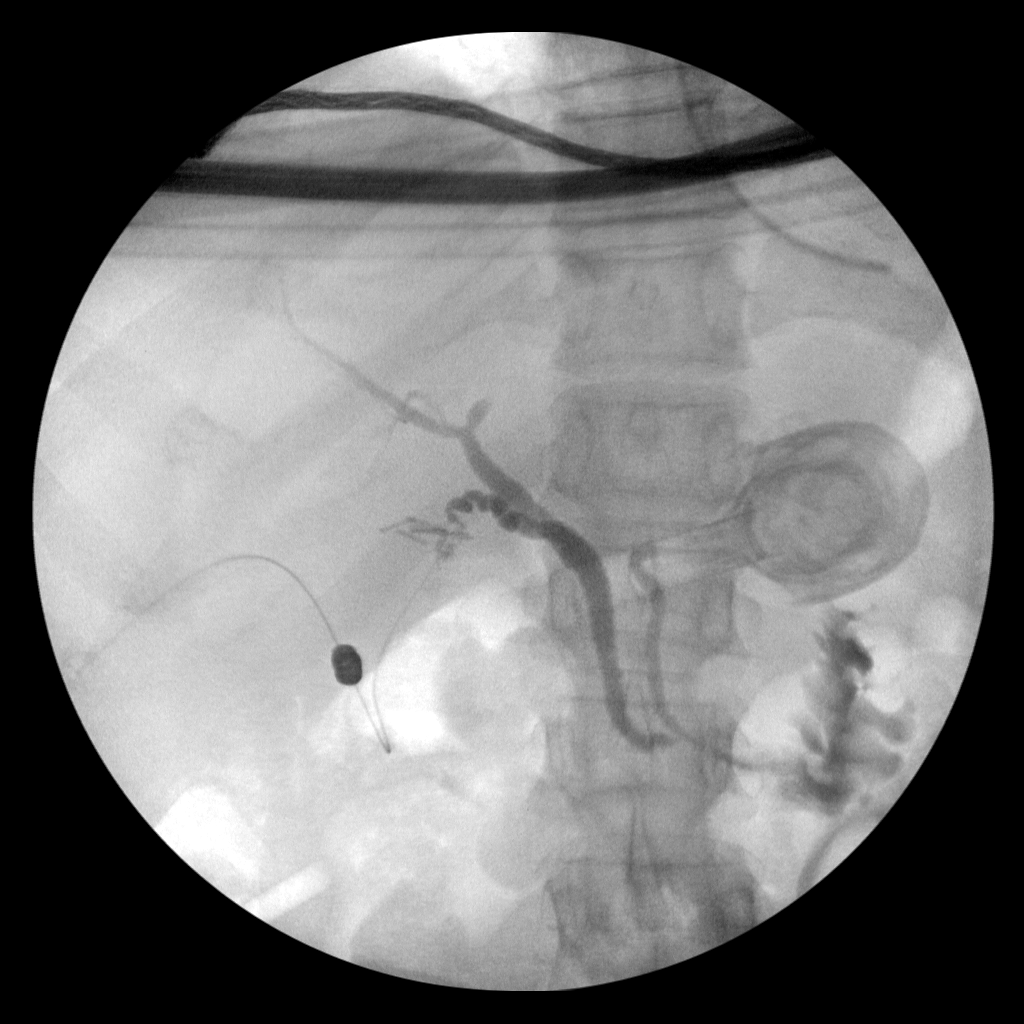
[frame 39/68]
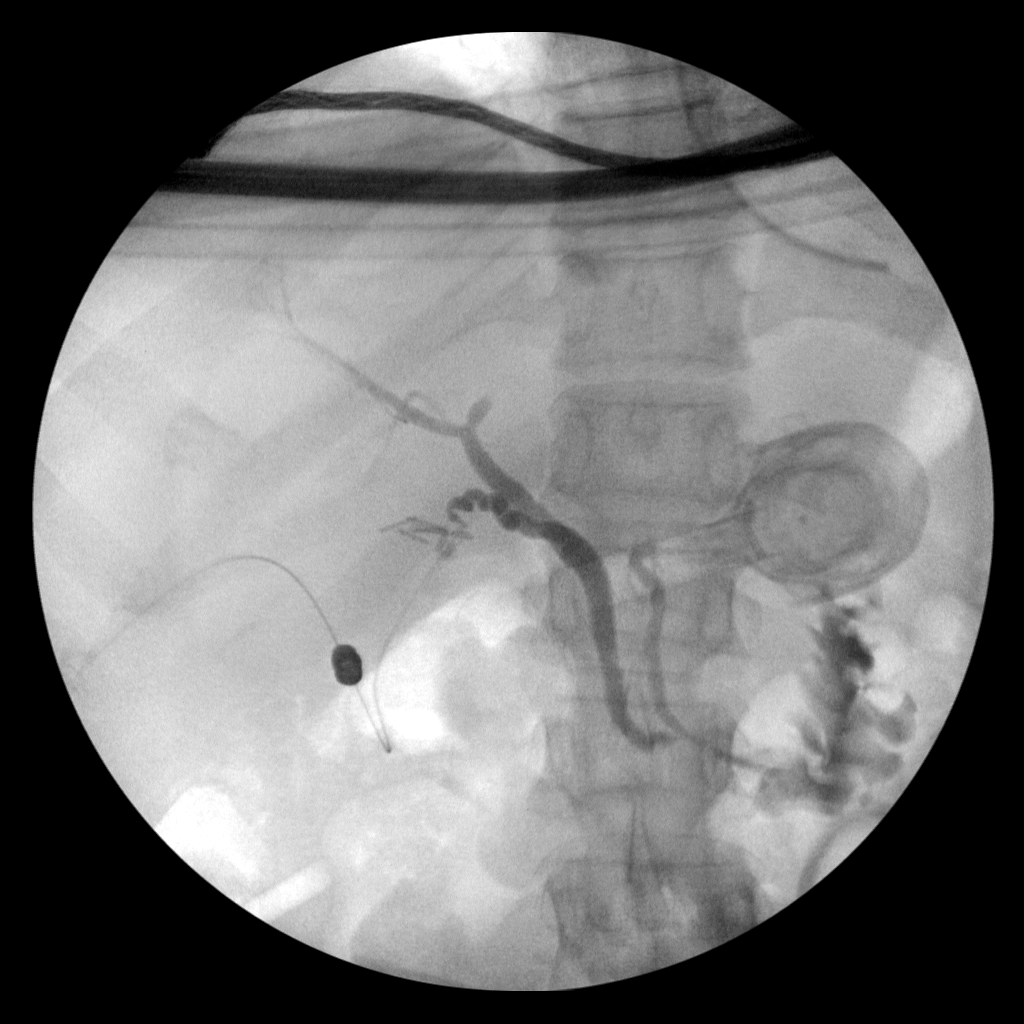
[frame 58/68]
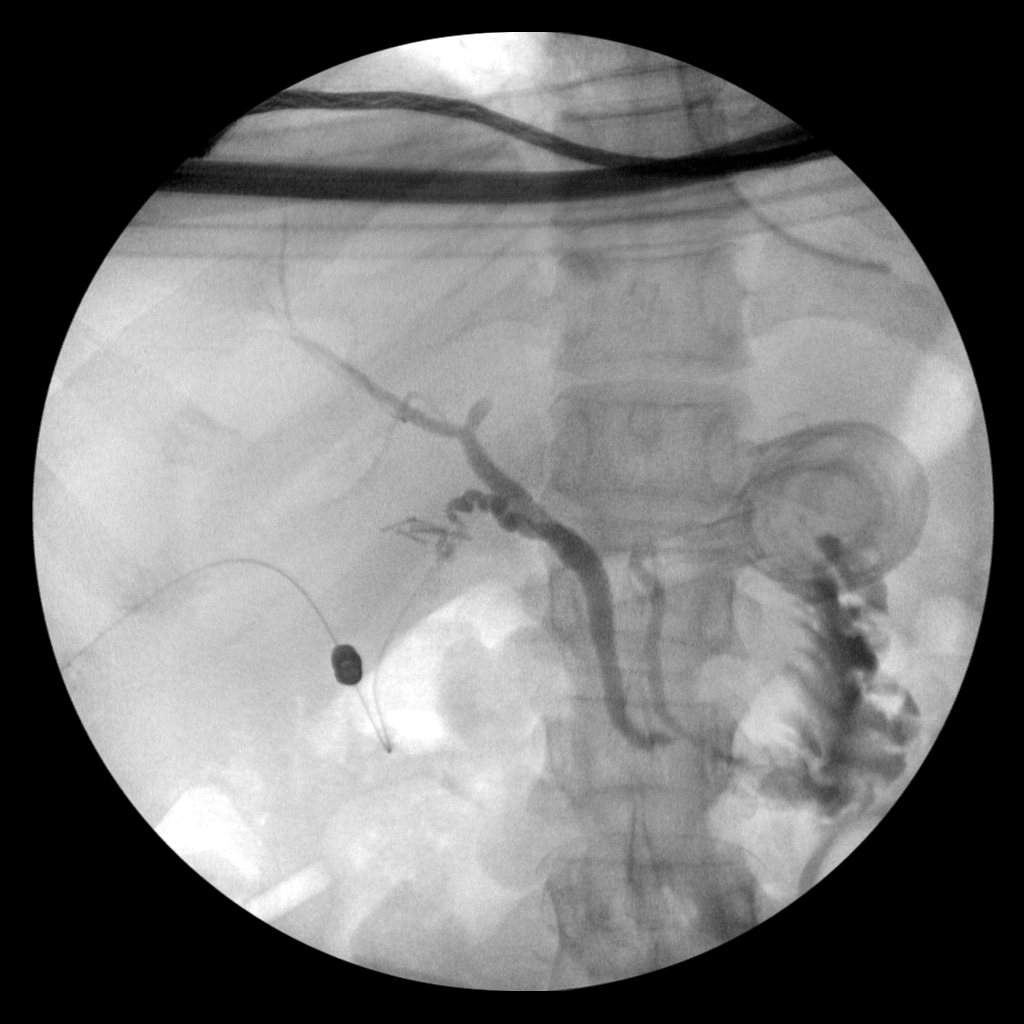

[4 of 4 positions shown; findings below may reference images not displayed]

FINDINGS: Surgical instruments project over the upper abdomen.

There is cannulation of the cystic duct/gallbladder neck, with
antegrade infusion of contrast. Caliber of the extrahepatic ductal
system within normal limits. Variant of the extrahepatic ductal
system, with the pancreatic duct draining both the extrahepatic
ducts and the pancreatic ducts into the third portion of the
duodenum.

No definite filling defect within the extrahepatic ducts identified.

Free flow of contrast across the ampulla.
IMPRESSION: Intraoperative cholangiogram demonstrates extrahepatic biliary ducts
of unremarkable caliber, with no definite filling defects
identified. Free flow of contrast across the ampulla. Note that
there is variant anatomy of the extrahepatic ducts and pancreatic
duct, with the ampulla into the third portion of the duodenum.

Please refer to the dictated operative report for full details of
intraoperative findings and procedure
